# Patient Record
Sex: Male | Born: 1935 | Race: White | Hispanic: No | Smoking: Never smoker
Health system: Southern US, Community
[De-identification: ages and names within clinical notes are randomized; demographics above are authoritative.]

## PROBLEM LIST (undated history)

## (undated) DIAGNOSIS — I10 Essential (primary) hypertension: Secondary | ICD-10-CM

## (undated) DIAGNOSIS — E785 Hyperlipidemia, unspecified: Secondary | ICD-10-CM

## (undated) HISTORY — PX: PERMANENT PACEMAKER INSERTION: SHX6023

## (undated) HISTORY — PX: TOTAL HIP ARTHROPLASTY: SHX124

## (undated) HISTORY — PX: REPLACEMENT TOTAL KNEE: SUR1224

---

## 2020-02-15 ENCOUNTER — Other Ambulatory Visit: Payer: Self-pay

## 2020-02-15 ENCOUNTER — Emergency Department (HOSPITAL_COMMUNITY): Payer: Medicare Other

## 2020-02-15 ENCOUNTER — Inpatient Hospital Stay (HOSPITAL_COMMUNITY): Payer: Medicare Other

## 2020-02-15 ENCOUNTER — Inpatient Hospital Stay (HOSPITAL_COMMUNITY)
Admission: EM | Admit: 2020-02-15 | Discharge: 2020-03-10 | DRG: 682 | Disposition: E | Payer: Medicare Other | Attending: Family Medicine | Admitting: Family Medicine

## 2020-02-15 ENCOUNTER — Encounter (HOSPITAL_COMMUNITY): Payer: Self-pay | Admitting: Family Medicine

## 2020-02-15 DIAGNOSIS — Z96652 Presence of left artificial knee joint: Secondary | ICD-10-CM | POA: Diagnosis present

## 2020-02-15 DIAGNOSIS — Z515 Encounter for palliative care: Secondary | ICD-10-CM | POA: Diagnosis not present

## 2020-02-15 DIAGNOSIS — R578 Other shock: Secondary | ICD-10-CM | POA: Diagnosis not present

## 2020-02-15 DIAGNOSIS — Z9181 History of falling: Secondary | ICD-10-CM

## 2020-02-15 DIAGNOSIS — E872 Acidosis, unspecified: Secondary | ICD-10-CM

## 2020-02-15 DIAGNOSIS — I959 Hypotension, unspecified: Secondary | ICD-10-CM | POA: Diagnosis present

## 2020-02-15 DIAGNOSIS — L89159 Pressure ulcer of sacral region, unspecified stage: Secondary | ICD-10-CM | POA: Diagnosis present

## 2020-02-15 DIAGNOSIS — T17908A Unspecified foreign body in respiratory tract, part unspecified causing other injury, initial encounter: Secondary | ICD-10-CM | POA: Diagnosis not present

## 2020-02-15 DIAGNOSIS — U071 COVID-19: Secondary | ICD-10-CM | POA: Diagnosis present

## 2020-02-15 DIAGNOSIS — D696 Thrombocytopenia, unspecified: Secondary | ICD-10-CM | POA: Diagnosis present

## 2020-02-15 DIAGNOSIS — Z9911 Dependence on respirator [ventilator] status: Secondary | ICD-10-CM | POA: Diagnosis not present

## 2020-02-15 DIAGNOSIS — Y92002 Bathroom of unspecified non-institutional (private) residence single-family (private) house as the place of occurrence of the external cause: Secondary | ICD-10-CM | POA: Diagnosis not present

## 2020-02-15 DIAGNOSIS — Z950 Presence of cardiac pacemaker: Secondary | ICD-10-CM | POA: Diagnosis not present

## 2020-02-15 DIAGNOSIS — R579 Shock, unspecified: Secondary | ICD-10-CM | POA: Diagnosis not present

## 2020-02-15 DIAGNOSIS — N179 Acute kidney failure, unspecified: Secondary | ICD-10-CM | POA: Diagnosis not present

## 2020-02-15 DIAGNOSIS — E860 Dehydration: Secondary | ICD-10-CM | POA: Diagnosis present

## 2020-02-15 DIAGNOSIS — R0602 Shortness of breath: Secondary | ICD-10-CM

## 2020-02-15 DIAGNOSIS — E870 Hyperosmolality and hypernatremia: Secondary | ICD-10-CM | POA: Diagnosis present

## 2020-02-15 DIAGNOSIS — J1282 Pneumonia due to coronavirus disease 2019: Secondary | ICD-10-CM | POA: Diagnosis present

## 2020-02-15 DIAGNOSIS — R197 Diarrhea, unspecified: Secondary | ICD-10-CM | POA: Diagnosis not present

## 2020-02-15 DIAGNOSIS — R195 Other fecal abnormalities: Secondary | ICD-10-CM | POA: Diagnosis present

## 2020-02-15 DIAGNOSIS — D509 Iron deficiency anemia, unspecified: Secondary | ICD-10-CM | POA: Diagnosis present

## 2020-02-15 DIAGNOSIS — R54 Age-related physical debility: Secondary | ICD-10-CM | POA: Diagnosis present

## 2020-02-15 DIAGNOSIS — I10 Essential (primary) hypertension: Secondary | ICD-10-CM | POA: Diagnosis present

## 2020-02-15 DIAGNOSIS — W19XXXA Unspecified fall, initial encounter: Secondary | ICD-10-CM | POA: Diagnosis not present

## 2020-02-15 DIAGNOSIS — W010XXA Fall on same level from slipping, tripping and stumbling without subsequent striking against object, initial encounter: Secondary | ICD-10-CM | POA: Diagnosis present

## 2020-02-15 DIAGNOSIS — J811 Chronic pulmonary edema: Secondary | ICD-10-CM | POA: Diagnosis present

## 2020-02-15 DIAGNOSIS — S7292XA Unspecified fracture of left femur, initial encounter for closed fracture: Secondary | ICD-10-CM

## 2020-02-15 DIAGNOSIS — E162 Hypoglycemia, unspecified: Secondary | ICD-10-CM | POA: Diagnosis present

## 2020-02-15 DIAGNOSIS — S62102A Fracture of unspecified carpal bone, left wrist, initial encounter for closed fracture: Secondary | ICD-10-CM | POA: Diagnosis present

## 2020-02-15 DIAGNOSIS — N170 Acute kidney failure with tubular necrosis: Secondary | ICD-10-CM | POA: Diagnosis present

## 2020-02-15 DIAGNOSIS — Z96643 Presence of artificial hip joint, bilateral: Secondary | ICD-10-CM | POA: Diagnosis present

## 2020-02-15 DIAGNOSIS — J9601 Acute respiratory failure with hypoxia: Secondary | ICD-10-CM | POA: Diagnosis not present

## 2020-02-15 DIAGNOSIS — E875 Hyperkalemia: Secondary | ICD-10-CM | POA: Diagnosis present

## 2020-02-15 DIAGNOSIS — Z7982 Long term (current) use of aspirin: Secondary | ICD-10-CM

## 2020-02-15 DIAGNOSIS — S72002A Fracture of unspecified part of neck of left femur, initial encounter for closed fracture: Secondary | ICD-10-CM | POA: Diagnosis not present

## 2020-02-15 DIAGNOSIS — R680 Hypothermia, not associated with low environmental temperature: Secondary | ICD-10-CM | POA: Diagnosis present

## 2020-02-15 DIAGNOSIS — G9341 Metabolic encephalopathy: Secondary | ICD-10-CM | POA: Diagnosis not present

## 2020-02-15 DIAGNOSIS — N19 Unspecified kidney failure: Secondary | ICD-10-CM

## 2020-02-15 DIAGNOSIS — D539 Nutritional anemia, unspecified: Secondary | ICD-10-CM | POA: Diagnosis not present

## 2020-02-15 DIAGNOSIS — R188 Other ascites: Secondary | ICD-10-CM | POA: Diagnosis present

## 2020-02-15 DIAGNOSIS — Z66 Do not resuscitate: Secondary | ICD-10-CM | POA: Diagnosis present

## 2020-02-15 DIAGNOSIS — Z888 Allergy status to other drugs, medicaments and biological substances status: Secondary | ICD-10-CM

## 2020-02-15 DIAGNOSIS — L988 Other specified disorders of the skin and subcutaneous tissue: Secondary | ICD-10-CM | POA: Diagnosis present

## 2020-02-15 DIAGNOSIS — Y92009 Unspecified place in unspecified non-institutional (private) residence as the place of occurrence of the external cause: Secondary | ICD-10-CM | POA: Diagnosis not present

## 2020-02-15 DIAGNOSIS — S0083XA Contusion of other part of head, initial encounter: Secondary | ICD-10-CM | POA: Diagnosis present

## 2020-02-15 DIAGNOSIS — S0081XA Abrasion of other part of head, initial encounter: Secondary | ICD-10-CM | POA: Diagnosis present

## 2020-02-15 DIAGNOSIS — S7290XA Unspecified fracture of unspecified femur, initial encounter for closed fracture: Secondary | ICD-10-CM

## 2020-02-15 DIAGNOSIS — E785 Hyperlipidemia, unspecified: Secondary | ICD-10-CM | POA: Diagnosis present

## 2020-02-15 DIAGNOSIS — K922 Gastrointestinal hemorrhage, unspecified: Secondary | ICD-10-CM

## 2020-02-15 DIAGNOSIS — L98411 Non-pressure chronic ulcer of buttock limited to breakdown of skin: Secondary | ICD-10-CM | POA: Diagnosis present

## 2020-02-15 HISTORY — DX: Essential (primary) hypertension: I10

## 2020-02-15 HISTORY — DX: Hyperlipidemia, unspecified: E78.5

## 2020-02-15 LAB — BASIC METABOLIC PANEL
Anion gap: 13 (ref 5–15)
Anion gap: 11 (ref 5–15)
BUN: 71 mg/dL — ABNORMAL HIGH (ref 8–23)
BUN: 72 mg/dL — ABNORMAL HIGH (ref 8–23)
CO2: 13 mmol/L — ABNORMAL LOW (ref 22–32)
CO2: 16 mmol/L — ABNORMAL LOW (ref 22–32)
Calcium: 8.7 mg/dL — ABNORMAL LOW (ref 8.9–10.3)
Calcium: 8 mg/dL — ABNORMAL LOW (ref 8.9–10.3)
Chloride: 112 mmol/L — ABNORMAL HIGH (ref 98–111)
Chloride: 112 mmol/L — ABNORMAL HIGH (ref 98–111)
Creatinine, Ser: 3.98 mg/dL — ABNORMAL HIGH (ref 0.61–1.24)
Creatinine, Ser: 3.81 mg/dL — ABNORMAL HIGH (ref 0.61–1.24)
GFR, Estimated: 13 mL/min — ABNORMAL LOW (ref >60)
GFR, Estimated: 14 mL/min — ABNORMAL LOW (ref >60)
Glucose, Bld: 62 mg/dL — ABNORMAL LOW (ref 70–99)
Glucose, Bld: 106 mg/dL — ABNORMAL HIGH (ref 70–99)
Potassium: 5.6 mmol/L — ABNORMAL HIGH (ref 3.5–5.1)
Potassium: 4.9 mmol/L (ref 3.5–5.1)
Sodium: 138 mmol/L (ref 135–145)
Sodium: 139 mmol/L (ref 135–145)

## 2020-02-15 LAB — RETICULOCYTES
Immature Retic Fract: 13.1 % (ref 2.3–15.9)
RBC.: 2.59 MIL/uL — ABNORMAL LOW (ref 4.22–5.81)
Retic Count, Absolute: 21.5 10*3/uL (ref 19.0–186.0)
Retic Ct Pct: 0.8 % (ref 0.4–3.1)

## 2020-02-15 LAB — I-STAT VENOUS BLOOD GAS, ED
Acid-base deficit: 12 mmol/L — ABNORMAL HIGH (ref 0.0–2.0)
Bicarbonate: 13.4 mmol/L — ABNORMAL LOW (ref 20.0–28.0)
Calcium, Ion: 1.07 mmol/L — ABNORMAL LOW (ref 1.15–1.40)
HCT: 23 % — ABNORMAL LOW (ref 39.0–52.0)
Hemoglobin: 7.8 g/dL — ABNORMAL LOW (ref 13.0–17.0)
O2 Saturation: 87 %
Potassium: 5.3 mmol/L — ABNORMAL HIGH (ref 3.5–5.1)
Sodium: 142 mmol/L (ref 135–145)
TCO2: 14 mmol/L — ABNORMAL LOW (ref 22–32)
pCO2, Ven: 27.6 mmHg — ABNORMAL LOW (ref 44.0–60.0)
pH, Ven: 7.294 (ref 7.250–7.430)
pO2, Ven: 57 mmHg — ABNORMAL HIGH (ref 32.0–45.0)

## 2020-02-15 LAB — VITAMIN B12: Vitamin B-12: 1032 pg/mL — ABNORMAL HIGH (ref 180–914)

## 2020-02-15 LAB — CBC
HCT: 27.9 % — ABNORMAL LOW (ref 39.0–52.0)
HCT: 26.6 % — ABNORMAL LOW (ref 39.0–52.0)
Hemoglobin: 8.7 g/dL — ABNORMAL LOW (ref 13.0–17.0)
Hemoglobin: 8.5 g/dL — ABNORMAL LOW (ref 13.0–17.0)
MCH: 33 pg (ref 26.0–34.0)
MCH: 32.8 pg (ref 26.0–34.0)
MCHC: 31.2 g/dL (ref 30.0–36.0)
MCHC: 32 g/dL (ref 30.0–36.0)
MCV: 105.7 fL — ABNORMAL HIGH (ref 80.0–100.0)
MCV: 102.7 fL — ABNORMAL HIGH (ref 80.0–100.0)
Platelets: 95 10*3/uL — ABNORMAL LOW (ref 150–400)
Platelets: 97 10*3/uL — ABNORMAL LOW (ref 150–400)
RBC: 2.64 MIL/uL — ABNORMAL LOW (ref 4.22–5.81)
RBC: 2.59 MIL/uL — ABNORMAL LOW (ref 4.22–5.81)
RDW: 15 % (ref 11.5–15.5)
RDW: 14.9 % (ref 11.5–15.5)
WBC: 10.6 10*3/uL — ABNORMAL HIGH (ref 4.0–10.5)
WBC: 9.1 10*3/uL (ref 4.0–10.5)
nRBC: 0 % (ref 0.0–0.2)
nRBC: 0 % (ref 0.0–0.2)

## 2020-02-15 LAB — IRON AND TIBC
Iron: 15 ug/dL — ABNORMAL LOW (ref 45–182)
Saturation Ratios: 6 % — ABNORMAL LOW (ref 17.9–39.5)
TIBC: 235 ug/dL — ABNORMAL LOW (ref 250–450)
UIBC: 220 ug/dL

## 2020-02-15 LAB — HEPATIC FUNCTION PANEL
ALT: 19 U/L (ref 0–44)
AST: 39 U/L (ref 15–41)
Albumin: 2.7 g/dL — ABNORMAL LOW (ref 3.5–5.0)
Alkaline Phosphatase: 128 U/L — ABNORMAL HIGH (ref 38–126)
Bilirubin, Direct: 0.5 mg/dL — ABNORMAL HIGH (ref 0.0–0.2)
Indirect Bilirubin: 0.6 mg/dL (ref 0.3–0.9)
Total Bilirubin: 1.1 mg/dL (ref 0.3–1.2)
Total Protein: 6.4 g/dL — ABNORMAL LOW (ref 6.5–8.1)

## 2020-02-15 LAB — MAGNESIUM: Magnesium: 2.2 mg/dL (ref 1.7–2.4)

## 2020-02-15 LAB — RESP PANEL BY RT-PCR (FLU A&B, COVID) ARPGX2
Influenza A by PCR: NEGATIVE
Influenza A by PCR: NEGATIVE
Influenza B by PCR: NEGATIVE
Influenza B by PCR: NEGATIVE
SARS Coronavirus 2 by RT PCR: POSITIVE — AB
SARS Coronavirus 2 by RT PCR: POSITIVE — AB

## 2020-02-15 LAB — PREPARE RBC (CROSSMATCH)

## 2020-02-15 LAB — TROPONIN I (HIGH SENSITIVITY)
Troponin I (High Sensitivity): 85 ng/L — ABNORMAL HIGH (ref <18)
Troponin I (High Sensitivity): 77 ng/L — ABNORMAL HIGH (ref <18)

## 2020-02-15 LAB — POC OCCULT BLOOD, ED: Fecal Occult Bld: POSITIVE — AB

## 2020-02-15 LAB — APTT: aPTT: 42 seconds — ABNORMAL HIGH (ref 24–36)

## 2020-02-15 LAB — FERRITIN: Ferritin: 270 ng/mL (ref 24–336)

## 2020-02-15 LAB — PROTIME-INR
INR: 1.7 — ABNORMAL HIGH (ref 0.8–1.2)
Prothrombin Time: 19.4 seconds — ABNORMAL HIGH (ref 11.4–15.2)

## 2020-02-15 LAB — URINALYSIS, ROUTINE W REFLEX MICROSCOPIC
Bacteria, UA: NONE SEEN
Bilirubin Urine: NEGATIVE
Glucose, UA: NEGATIVE mg/dL
Hgb urine dipstick: NEGATIVE
Ketones, ur: NEGATIVE mg/dL
Leukocytes,Ua: NEGATIVE
Nitrite: NEGATIVE
Protein, ur: 30 mg/dL — AB
Specific Gravity, Urine: 1.021 (ref 1.005–1.030)
pH: 5 (ref 5.0–8.0)

## 2020-02-15 LAB — LACTIC ACID, PLASMA
Lactic Acid, Venous: 2.1 mmol/L (ref 0.5–1.9)
Lactic Acid, Venous: 1.8 mmol/L (ref 0.5–1.9)

## 2020-02-15 LAB — FOLATE: Folate: 8.8 ng/mL (ref >5.9)

## 2020-02-15 LAB — ABO/RH: ABO/RH(D): A POS

## 2020-02-15 MED ORDER — ACETAMINOPHEN 325 MG PO TABS
650.0000 mg | ORAL_TABLET | Freq: Once | ORAL | Status: AC
  Administered 2020-02-15: 650 mg via ORAL
  Filled 2020-02-15: qty 2

## 2020-02-15 MED ORDER — NOREPINEPHRINE 4 MG/250ML-% IV SOLN
INTRAVENOUS | Status: AC
  Administered 2020-02-16: 2 ug/min via INTRAVENOUS
  Filled 2020-02-15: qty 250

## 2020-02-15 MED ORDER — SODIUM CHLORIDE 0.9 % IV BOLUS
500.0000 mL | Freq: Once | INTRAVENOUS | Status: AC
  Administered 2020-02-15: 500 mL via INTRAVENOUS

## 2020-02-15 MED ORDER — SODIUM BICARBONATE 8.4 % IV SOLN
50.0000 meq | Freq: Once | INTRAVENOUS | Status: AC
  Administered 2020-02-15: 50 meq via INTRAVENOUS
  Filled 2020-02-15: qty 50

## 2020-02-15 MED ORDER — SODIUM BICARBONATE 8.4 % IV SOLN
INTRAVENOUS | Status: DC
  Filled 2020-02-15: qty 850

## 2020-02-15 MED ORDER — SODIUM CHLORIDE 0.9 % IV BOLUS (SEPSIS)
500.0000 mL | Freq: Once | INTRAVENOUS | Status: AC
  Administered 2020-02-15: 500 mL via INTRAVENOUS

## 2020-02-15 MED ORDER — HYDROCODONE-ACETAMINOPHEN 5-325 MG PO TABS: 1.0000 | ORAL_TABLET | Freq: Four times a day (QID) | ORAL | Status: DC | PRN

## 2020-02-15 MED ORDER — SODIUM BICARBONATE 8.4 % IV SOLN
INTRAVENOUS | Status: DC
  Filled 2020-02-15 (×4): qty 850

## 2020-02-15 MED ORDER — SODIUM CHLORIDE 0.9% IV SOLUTION: Freq: Once | INTRAVENOUS | Status: AC

## 2020-02-15 MED ORDER — MORPHINE SULFATE (PF) 2 MG/ML IV SOLN
0.5000 mg | INTRAVENOUS | Status: DC | PRN
  Administered 2020-02-15 (×2): 0.5 mg via INTRAVENOUS
  Filled 2020-02-15 (×2): qty 1

## 2020-02-15 MED ORDER — DEXTROSE 50 % IV SOLN
1.0000 | Freq: Once | INTRAVENOUS | Status: AC
  Administered 2020-02-15: 50 mL via INTRAVENOUS
  Filled 2020-02-15: qty 50

## 2020-02-15 NOTE — H&P (Addendum)
History and Physical  Derek Mcdaniel WJX:[id number] DOB: October 05, 1921 DOA: 02/28/2020  PCP: Lavonne Chick, MD   Chief Complaint: fall  HPI:  87 year old man veteran of [place], [place] retired Animal nutritionist, PMH pacemaker, essential hypertension hyperlipidemia presented to the emergency department after a fall at home.  Work-up revealed left proximal femur fracture, acute kidney injury, anemia, thrombocytopenia, hypotension and hypothermia.  Despite this, no signs or symptoms to suggest sepsis.  Screening Covid was positive though this may be error.  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  He just visited his daughter who lives here for Christmas returned home fell and broke his wrist, therefore she brought him back several days ago.  Appetite has been poor and oral intake has been quite poor.  He has been using a walker since being at his daughter's.  Today he was ambulating to the bathroom where he lost his balance and fell forward.  No loss of consciousness reported.  Grandson was at his side.  Patient was unable to get up so EMS was called.   Patient denies pain.  He has no other complaints and his review of systems is unrevealing.  Reports 2 weeks of diarrhea.  Chart review: . First admission this hospital system  ED Course: Treated with fluids, imaging pending.  Review of Systems:  Negative for fever, visual changes, sore throat, rash, chest pain, shortness of breath, dysuria, bleeding, nausea, vomiting, abdominal pain.  PMH . Essential hypertension . Hyperlipidemia . No cardiac disease other than pacemaker placement.  PSH . Pacemaker placement . Left knee replacement . Bilateral total hip arthroplasty  Family history includes: . Lived to old age, no specific medical problems were called  Social History . Never smoker . Nondrinker  Allergies . Ambien caused agitation  Meds  include: . Take something for hypertension and hyperlipidemia  Physicial Exam   Vitals:  . 96.2, 16, 69, 70, 92/54  Constitutional:   . Appears calm and comfortable lying on stretcher in the emergency department.  Appears frail but nontoxic.  Does not appear ill. Eyes:  . pupils and irises appear normal . Normal lids  ENMT:  . grossly normal hearing  . Lips appear normal Neck:  . neck appears normal, no masses . no thyromegaly Respiratory:  . CTA bilaterally, no w/r/r.  . Respiratory effort normal.  Cardiovascular:  . RRR, no m/r/g . No LE extremity edema   Abdomen:  . Abdomen appears normal; no tenderness or masses . No hernias noted Musculoskeletal:  . RUE, LUE, RLE, LLE   . Moves all extremities to command.  Strength and tone appear grossly intact.  Some left leg weakness probably from hip fracture. Skin:  . Grossly unremarkable Neurologic:  . Grossly nonfocal Psychiatric:  . Mental status o Mood, affect appropriate . judgment and insight appear intact   I have personally reviewed following labs and imaging studies  Labs:  Marland Kitchen Potassium 5.6, glucose 62, creatinine 3.98, BUN 71 . Troponins mildly elevated 85, 77 . Lactic acid within normal limits . Hemoglobin 8.7, 7.8 macrocytic . Platelets 95 . INR 1.7 . Screening Covid positive . Fecal occult blood positive  Imaging studies:   Plain film left femur fracture, possible left anterior pubic ramus fracture  Chest x-ray independently reviewed no acute disease  Medical tests:   EKG independently reviewed: Ventricular paced rhythm  ASSESSMENT/PLAN  87 year old man veteran World War [place], [place],  presented after a fall and found to have multiple abnormalities including proximal left femur fracture, acute kidney injury with hyperkalemia, anemia, thrombocytopenia, hypotension and hypothermia.  Acute kidney injury with mild hyperkalemia, nonanion gap metabolic acidosis --Prerenal pattern, history of  very poor oral intake, may be complicated by hypotension.  No signs or symptoms to suggest sepsis at this time. --Given hypoglycemia, D5 with sodium bicarb for acidosis. --Trend BMP.  Treat potassium w/ IVF, and repeat tonight.  Hypotension, hypothermia, diarrhea --Patient appears frail but otherwise well, there are no signs or symptoms to suggest sepsis or significant infection.  History most consistent with profound dehydration secondary to poor oral intake and 2 weeks of diarrhea. Cannot r/o component of fx hemorrhage at this time. --Passive rewarming, aggressive IV fluids, rule out C. Difficile, trend Hgb.  Macrocytic anemia, thrombocytopenia --Unclear chronicity, suspect component of acute blood loss anemia from hip fx, superimposed on chronic anemia. Fecal occult blood positive, unclear significance, no reported bleeding, however has had diarrhea for the last 2 weeks.  Could be hemorrhoidal. --Hemoglobin slightly lower.  Blood is acceptable if needed. Type and cross 2 units but hold for now.  Trend hemoglobin.  Check anemia panel.  Fall at home --Patient denies complaints.  Left femur fracture on plain films.  Possible pubic ramus fracture. --CT head, maxillofacial, spine, abdomen pelvis pending.  Proximal left femur fracture status post fall --Hip fracture protocol.  Orthopedics consultation is pending.  Patient has no cardiac history, still works and normally ambulates with a cane.  No history of chest pain or shortness of breath.  Advanced age is biggest risk factor, however would proceed with surgery w/o further medical workup, however note cervical spine CT: "Advanced arthropathy at the C1-2 articulation with 6 mm of posterior displacement of C1 relative to C2. Stenosis of the spinal canal at this level that could affect the spinal cord. I do not see evidence of an acute fracture in that region. 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 Other arthritides could also affect this  joint." --discussed w/  Dr. Lovell Sheehan and advised on precautions/recs in regard to anesthesia for ortho surgery. Neutral intubation with glidoscope recommended.  Positive Covid test --May be spurious.  The patient next-door reportedly is Covid positive diagnosed prior to admission, however was negative here in the emergency department.  Will retest, continue airborne precautions for now. --If second test is negative, would DC precautions.  Wrist fracture from fall prior to admission --Cast just put on recently.  Follow-up as an outpatient.  Although patient appears clinically much better than chart findings would suggest, admit to progressive care given multiple acute abnormalities and prognosis guarded.  DVT prophylaxis: SCDs Code Status: DNR Family Communication: daughter Isabelle Course at bedside Consults called: orthopedics    Time spent: 34 minutes  Brendia Sacks, MD  [facility] Direct contact: see [email or web address]  7PM-7AM contact night coverage as below   1. Check the care team in [facility] and look for a) attending/consulting [facility] provider listed and b) the [facility] team listed 2. Log into [email or web address] and use [facility] universal password to access. If you do not have the password, please contact the hospital operator. 3. Locate the [facility] provider you are looking for under [facility] and page to a number that you can be directly reached. 4. If you still have difficulty reaching the provider, please page the [facility] (Director on Call) for the Hospitalists listed on amion for assistance.  Severity of Illness: The appropriate patient status for this patient  is INPATIENT. Inpatient status is judged to be reasonable and necessary in order to provide the required intensity of service to ensure the patient's safety. The patient's presenting symptoms, physical exam findings, and initial radiographic and laboratory data in the context of their chronic comorbidities is felt to place them at high  risk for further clinical deterioration. Furthermore, it is not anticipated that the patient will be medically stable for discharge from the hospital within 2 midnights of admission. The following factors support the patient status of inpatient.   " The patient's presenting symptoms include fall at home. " The worrisome physical exam findings include hypotension, hyporthermia. " The initial radiographic and laboratory data are worrisome because of AKI, anemia, thrombocytopenia, femur fracture. " The chronic co-morbidities include HTN, hyperlipidemia.   * I certify that at the point of admission it is my clinical judgment that the patient will require inpatient hospital care spanning beyond 2 midnights from the point of admission due to high intensity of service, high risk for further deterioration and high frequency of surveillance required.*   Status is: Inpatient  Remains inpatient appropriate because:Hemodynamically unstable, IV treatments appropriate due to intensity of illness or inability to take PO and Inpatient level of care appropriate due to severity of illness   Dispo: The patient is from: Home              Anticipated d/c is to: TBD              Anticipated d/c date is: > 3 days              Patient currently is not medically stable to d/c.  03/02/2020, 7:35 PM   Principal Problem:   AKI (acute kidney injury) (HCC) Active Problems:   Metabolic acidosis   Hypotension   Diarrhea   Macrocytic anemia   Thrombocytopenia (HCC)   Fall at home, initial encounter   Femur fracture (HCC)

## 2020-02-15 NOTE — ED Provider Notes (Addendum)
[facility] EMERGENCY DEPARTMENT Provider Note   CSN: [id number] Arrival date & time: 02/24/2020  1426     History Chief Complaint  Patient presents with  . Weakness    Derek Mcdaniel is a 87 y.o. male.  HPI   87 year old male with past medical history of HTN, HLD, pacemaker, left forearm injury that is casted from a previous mechanical fall who currently lives home alone with no help presents to the emergency department with fall and weakness.  Patient states over the last 3 days has had decreased appetite, he has had profuse nonbloody diarrhea with lower abdominal cramping.  Today when he was in the bathroom and he tried to reach to turn the light on he said he fell forward, hitting the front of his face on the ground.  Did not lose consciousness.  He usually ambulates slowly but independently with a walker.  He admits to mild left upper thigh pain.  He is noted to be on baby aspirin daily, patient believes he takes this every day.  He denies any chest pain, shortness of breath, cough, vomiting.  No past medical history on file.  There are no problems to display for this patient.      No family history on file.     Home Medications Prior to Admission medications   Not on File    Allergies    Patient has no allergy information on record.  Review of Systems   Review of Systems  Constitutional: Positive for appetite change, chills and fatigue. Negative for fever.  HENT: Negative for congestion.   Eyes: Negative for visual disturbance.  Respiratory: Negative for shortness of breath.   Cardiovascular: Negative for chest pain.  Gastrointestinal: Positive for abdominal pain and diarrhea. Negative for blood in stool and vomiting.  Genitourinary: Negative for dysuria.  Musculoskeletal:       Left forearm injury from previous fall  Skin: Negative for rash.  Neurological: Negative for headaches.    Physical Exam Updated Vital Signs BP (!) 79/50 (BP  Location: Right Arm)   Pulse 68   Temp (!) 96.2 F (35.7 C) (Rectal)   Resp 15   Ht 5\' 5"  (1.651 m)   Wt 61.2 kg   SpO2 100%   BMI 22.47 kg/m   Physical Exam Vitals and nursing note reviewed.  Constitutional:      Appearance: Normal appearance.  HENT:     Head:     Comments: Scattered bruising and small abrasions along the frontal area    Nose: Nose normal.     Mouth/Throat:     Mouth: Mucous membranes are moist.  Eyes:     Extraocular Movements: Extraocular movements intact.     Comments: Pinpoint pupils but reactive and equal bilaterally  Cardiovascular:     Rate and Rhythm: Normal rate.  Pulmonary:     Effort: Pulmonary effort is normal. No respiratory distress.  Abdominal:     Palpations: Abdomen is soft.     Comments: Mild tenderness to palpation in the lower quadrants without any guarding  Genitourinary:    Comments: Significant skin breakdown mostly on the right groin, small pressure sore in the sacral area Musculoskeletal:        General: Swelling present. No deformity.     Cervical back: Neck supple. No tenderness.     Comments: Mild tenderness to the left lateral and upper thigh, no obvious deformity, no significant pain with passive range of motion, patient has limited active range  of motion which she says is baseline  Skin:    General: Skin is warm.  Neurological:     Mental Status: He is alert and oriented to person, place, and time. Mental status is at baseline.  Psychiatric:        Mood and Affect: Mood normal.     ED Results / Procedures / Treatments   Labs (all labs ordered are listed, but only abnormal results are displayed) Labs Reviewed  BASIC METABOLIC PANEL - Abnormal; Notable for the following components:      Result Value   Potassium 5.6 (*)    Chloride 112 (*)    CO2 13 (*)    Glucose, Bld 62 (*)    BUN 71 (*)    Creatinine, Ser 3.98 (*)    Calcium 8.7 (*)    GFR, Estimated 13 (*)    All other components within normal limits  CBC -  Abnormal; Notable for the following components:   WBC 10.6 (*)    RBC 2.64 (*)    Hemoglobin 8.7 (*)    HCT 27.9 (*)    MCV 105.7 (*)    Platelets 95 (*)    All other components within normal limits  LACTIC ACID, PLASMA - Abnormal; Notable for the following components:   Lactic Acid, Venous 2.1 (*)    All other components within normal limits  CULTURE, BLOOD (SINGLE)  URINALYSIS, ROUTINE W REFLEX MICROSCOPIC  LACTIC ACID, PLASMA  MAGNESIUM  PROTIME-INR  APTT  CBG MONITORING, ED  POC OCCULT BLOOD, ED  TROPONIN I (HIGH SENSITIVITY)    EKG None  Radiology No results found.  Procedures .Critical Care Performed by: Rozelle Logan, DO Authorized by: Rozelle Logan, DO   Critical care provider statement:    Critical care time (minutes):  60   Critical care was necessary to treat or prevent imminent or life-threatening deterioration of the following conditions:  Circulatory failure, dehydration, endocrine crisis, metabolic crisis, shock, sepsis and trauma   Critical care was time spent personally by me on the following activities:  Discussions with consultants, evaluation of patient's response to treatment, examination of patient, ordering and performing treatments and interventions, ordering and review of laboratory studies, ordering and review of radiographic studies, pulse oximetry, re-evaluation of patient's condition, obtaining history from patient or surrogate and review of old charts   I assumed direction of critical care for this patient from another provider in my specialty: no     (including critical care time)  Medications Ordered in ED Medications  sodium chloride 0.9 % bolus 500 mL (has no administration in time range)    ED Course  I have reviewed the triage vital signs and the nursing notes.  Pertinent labs & imaging results that were available during my care of the patient were reviewed by me and considered in my medical decision making (see chart for  details).    MDM Rules/Calculators/A&P                          87 year old male presents the emergency department with mechanical fall and head/face injury.  3 days of diarrhea, decreased appetite and lower abdominal discomfort.  He is hypotensive and hypothermic on arrival.  Eyes open, conversational and oriented.  Left forearm is casted from previous fall.  Vitals are otherwise stable, he is on room air and conversational.  EKG is a paced rhythm.  Blood work shows an anemia, unclear what his  baseline is.  He is fecal occult positive.  Patient has renal failure with hyperkalemia and low bicarb.  His blood pressure was 70 systolic, he is sitting up, conversational and oriented.  He is also noted to be hypothermic.  He has some lower abdominal discomfort to palpation and left hip pain.  X-ray shows a left hip fracture and possible pubic rami deformity.  Patient's blood pressure has been fluid responsive.  Consulted nephrology who recommend amp of bicarb and then bicarb/D5 fluids for hyperkalemia and hydration.  Orthopedics has been consulted for the hip fracture.  Type and screen has been ordered.  We are pending CTs of the head, face and neck as well as abdomen pelvis with the complaint of diarrhea.  Patient will be admitted to hospitalist with nephrology and orthopedic consult.  On reevaluation blood pressure is systolic in the 80s, he continues to be baseline cognition and conversational.  Stable at time of admission.  Final Clinical Impression(s) / ED Diagnoses Final diagnoses:  None    Rx / DC Orders ED Discharge Orders    None       Rozelle Logan, DO 03-01-20 1809    Rozelle Logan, DO Mar 01, 2020 1810

## 2020-02-15 NOTE — ED Triage Notes (Addendum)
Patient arrived by [facility] from home after having witnessed fall today. Patient alert and denies pain. No respiratory complaints. Arrived with cast to left arm. Patient found to have BP in the 70s

## 2020-02-16 ENCOUNTER — Inpatient Hospital Stay (HOSPITAL_COMMUNITY): Payer: Medicare Other

## 2020-02-16 ENCOUNTER — Encounter (HOSPITAL_COMMUNITY): Payer: Self-pay | Admitting: Critical Care Medicine

## 2020-02-16 DIAGNOSIS — Z9911 Dependence on respirator [ventilator] status: Secondary | ICD-10-CM

## 2020-02-16 DIAGNOSIS — S72002A Fracture of unspecified part of neck of left femur, initial encounter for closed fracture: Secondary | ICD-10-CM | POA: Diagnosis not present

## 2020-02-16 DIAGNOSIS — D539 Nutritional anemia, unspecified: Secondary | ICD-10-CM

## 2020-02-16 DIAGNOSIS — I959 Hypotension, unspecified: Secondary | ICD-10-CM | POA: Diagnosis not present

## 2020-02-16 DIAGNOSIS — S7292XA Unspecified fracture of left femur, initial encounter for closed fracture: Secondary | ICD-10-CM | POA: Diagnosis not present

## 2020-02-16 DIAGNOSIS — N179 Acute kidney failure, unspecified: Secondary | ICD-10-CM | POA: Diagnosis not present

## 2020-02-16 DIAGNOSIS — R579 Shock, unspecified: Secondary | ICD-10-CM | POA: Diagnosis not present

## 2020-02-16 LAB — I-STAT ARTERIAL BLOOD GAS, ED
Acid-base deficit: 5 mmol/L — ABNORMAL HIGH (ref 0.0–2.0)
Acid-base deficit: 5 mmol/L — ABNORMAL HIGH (ref 0.0–2.0)
Bicarbonate: 20.7 mmol/L (ref 20.0–28.0)
Bicarbonate: 20 mmol/L (ref 20.0–28.0)
Calcium, Ion: 1.06 mmol/L — ABNORMAL LOW (ref 1.15–1.40)
Calcium, Ion: 1.07 mmol/L — ABNORMAL LOW (ref 1.15–1.40)
HCT: 27 % — ABNORMAL LOW (ref 39.0–52.0)
HCT: 27 % — ABNORMAL LOW (ref 39.0–52.0)
Hemoglobin: 9.2 g/dL — ABNORMAL LOW (ref 13.0–17.0)
Hemoglobin: 9.2 g/dL — ABNORMAL LOW (ref 13.0–17.0)
O2 Saturation: 62 %
O2 Saturation: 90 %
Potassium: 4.5 mmol/L (ref 3.5–5.1)
Potassium: 4.5 mmol/L (ref 3.5–5.1)
Sodium: 142 mmol/L (ref 135–145)
Sodium: 143 mmol/L (ref 135–145)
TCO2: 22 mmol/L (ref 22–32)
TCO2: 21 mmol/L — ABNORMAL LOW (ref 22–32)
pCO2 arterial: 37.6 mmHg (ref 32.0–48.0)
pCO2 arterial: 33.5 mmHg (ref 32.0–48.0)
pH, Arterial: 7.348 — ABNORMAL LOW (ref 7.350–7.450)
pH, Arterial: 7.383 (ref 7.350–7.450)
pO2, Arterial: 34 mmHg — CL (ref 83.0–108.0)
pO2, Arterial: 60 mmHg — ABNORMAL LOW (ref 83.0–108.0)

## 2020-02-16 LAB — RENAL FUNCTION PANEL
Albumin: 2.4 g/dL — ABNORMAL LOW (ref 3.5–5.0)
Anion gap: 15 (ref 5–15)
BUN: 73 mg/dL — ABNORMAL HIGH (ref 8–23)
CO2: 16 mmol/L — ABNORMAL LOW (ref 22–32)
Calcium: 7.9 mg/dL — ABNORMAL LOW (ref 8.9–10.3)
Chloride: 110 mmol/L (ref 98–111)
Creatinine, Ser: 3.88 mg/dL — ABNORMAL HIGH (ref 0.61–1.24)
GFR, Estimated: 14 mL/min — ABNORMAL LOW (ref >60)
Glucose, Bld: 124 mg/dL — ABNORMAL HIGH (ref 70–99)
Phosphorus: 5.7 mg/dL — ABNORMAL HIGH (ref 2.5–4.6)
Potassium: 5 mmol/L (ref 3.5–5.1)
Sodium: 141 mmol/L (ref 135–145)

## 2020-02-16 LAB — CBC
HCT: 26.9 % — ABNORMAL LOW (ref 39.0–52.0)
Hemoglobin: 9 g/dL — ABNORMAL LOW (ref 13.0–17.0)
MCH: 33.5 pg (ref 26.0–34.0)
MCHC: 33.5 g/dL (ref 30.0–36.0)
MCV: 100 fL (ref 80.0–100.0)
Platelets: 85 10*3/uL — ABNORMAL LOW (ref 150–400)
RBC: 2.69 MIL/uL — ABNORMAL LOW (ref 4.22–5.81)
RDW: 15.1 % (ref 11.5–15.5)
WBC: 7 10*3/uL (ref 4.0–10.5)
nRBC: 0 % (ref 0.0–0.2)

## 2020-02-16 LAB — SODIUM, URINE, RANDOM: Sodium, Ur: 19 mmol/L

## 2020-02-16 LAB — D-DIMER, QUANTITATIVE: D-Dimer, Quant: >20 ug/mL-FEU — ABNORMAL HIGH (ref 0.00–0.50)

## 2020-02-16 LAB — LACTIC ACID, PLASMA: Lactic Acid, Venous: 2.4 mmol/L (ref 0.5–1.9)

## 2020-02-16 LAB — CREATININE, URINE, RANDOM: Creatinine, Urine: 145.51 mg/dL

## 2020-02-16 LAB — SARS CORONAVIRUS 2 (TAT 6-24 HRS): SARS Coronavirus 2: POSITIVE — AB

## 2020-02-16 LAB — FERRITIN: Ferritin: 296 ng/mL (ref 24–336)

## 2020-02-16 LAB — C-REACTIVE PROTEIN: CRP: 16.7 mg/dL — ABNORMAL HIGH (ref <1.0)

## 2020-02-16 MED ORDER — SODIUM CHLORIDE 0.9 % IV SOLN
250.0000 mL | INTRAVENOUS | Status: DC
  Administered 2020-02-16: 250 mL via INTRAVENOUS

## 2020-02-16 MED ORDER — LOPERAMIDE HCL 2 MG PO CAPS: 2.0000 mg | ORAL_CAPSULE | ORAL | Status: DC | PRN

## 2020-02-16 MED ORDER — SODIUM CHLORIDE 0.9 % IV SOLN: 100.0000 mg | Freq: Every day | INTRAVENOUS | Status: DC

## 2020-02-16 MED ORDER — GLYCOPYRROLATE 0.2 MG/ML IJ SOLN: 0.2000 mg | INTRAMUSCULAR | Status: DC | PRN

## 2020-02-16 MED ORDER — GLYCOPYRROLATE 1 MG PO TABS
1.0000 mg | ORAL_TABLET | ORAL | Status: DC | PRN
  Filled 2020-02-16: qty 1

## 2020-02-16 MED ORDER — NOREPINEPHRINE 4 MG/250ML-% IV SOLN: 2.0000 ug/min | INTRAVENOUS | Status: DC

## 2020-02-16 MED ORDER — ONDANSETRON 4 MG PO TBDP: 4.0000 mg | ORAL_TABLET | Freq: Four times a day (QID) | ORAL | Status: DC | PRN

## 2020-02-16 MED ORDER — POLYVINYL ALCOHOL 1.4 % OP SOLN
1.0000 [drp] | Freq: Four times a day (QID) | OPHTHALMIC | Status: DC | PRN
  Filled 2020-02-16: qty 15

## 2020-02-16 MED ORDER — ONDANSETRON HCL 4 MG/2ML IJ SOLN: 4.0000 mg | Freq: Four times a day (QID) | INTRAMUSCULAR | Status: DC | PRN

## 2020-02-16 MED ORDER — FENTANYL CITRATE (PF) 100 MCG/2ML IJ SOLN
50.0000 ug | INTRAMUSCULAR | Status: DC | PRN
Start: 2020-02-16 — End: 2020-02-17
  Administered 2020-02-17: 50 ug via INTRAVENOUS

## 2020-02-16 MED ORDER — SODIUM CHLORIDE 0.9 % IV SOLN
1.0000 g | INTRAVENOUS | Status: DC
  Administered 2020-02-16: 1 g via INTRAVENOUS
  Filled 2020-02-16: qty 10

## 2020-02-16 MED ORDER — ACETAMINOPHEN 325 MG PO TABS: 650.0000 mg | ORAL_TABLET | Freq: Four times a day (QID) | ORAL | Status: DC | PRN

## 2020-02-16 MED ORDER — SODIUM CHLORIDE 0.9 % IV SOLN
200.0000 mg | Freq: Once | INTRAVENOUS | Status: DC
  Filled 2020-02-16: qty 40

## 2020-02-16 MED ORDER — SODIUM CHLORIDE 0.9 % IV SOLN
500.0000 mg | INTRAVENOUS | Status: DC
  Filled 2020-02-16: qty 500

## 2020-02-16 MED ORDER — FENTANYL CITRATE (PF) 100 MCG/2ML IJ SOLN
25.0000 ug | INTRAMUSCULAR | Status: DC | PRN
Start: 2020-02-16 — End: 2020-02-16
  Administered 2020-02-16: 25 ug via INTRAVENOUS
  Filled 2020-02-16: qty 2

## 2020-02-16 MED ORDER — ACETAMINOPHEN 650 MG RE SUPP: 650.0000 mg | Freq: Four times a day (QID) | RECTAL | Status: DC | PRN

## 2020-02-16 MED ORDER — HALOPERIDOL LACTATE 5 MG/ML IJ SOLN: 0.5000 mg | INTRAMUSCULAR | Status: DC | PRN

## 2020-02-16 MED ORDER — SODIUM CHLORIDE 0.9 % IV BOLUS
1000.0000 mL | Freq: Once | INTRAVENOUS | Status: AC
  Administered 2020-02-16: 1000 mL via INTRAVENOUS

## 2020-02-16 MED ORDER — HALOPERIDOL 1 MG PO TABS: 0.5000 mg | ORAL_TABLET | ORAL | Status: DC | PRN

## 2020-02-16 MED ORDER — LORAZEPAM 2 MG/ML IJ SOLN
1.0000 mg | Freq: Once | INTRAMUSCULAR | Status: AC
  Administered 2020-02-16: 1 mg via INTRAVENOUS
  Filled 2020-02-16: qty 1

## 2020-02-16 MED ORDER — LORAZEPAM 2 MG/ML IJ SOLN
1.0000 mg | INTRAMUSCULAR | Status: DC | PRN
  Administered 2020-02-16 – 2020-02-17 (×2): 1 mg via INTRAVENOUS
  Filled 2020-02-16 (×2): qty 1

## 2020-02-16 MED ORDER — HALOPERIDOL LACTATE 2 MG/ML PO CONC
0.5000 mg | ORAL | Status: DC | PRN
  Filled 2020-02-16: qty 0.3

## 2020-02-16 MED ORDER — FENTANYL 2500MCG IN NS 250ML (10MCG/ML) PREMIX INFUSION
0.0000 ug/h | INTRAVENOUS | Status: DC
  Administered 2020-02-16: 25 ug/h via INTRAVENOUS
  Filled 2020-02-16: qty 250

## 2020-02-16 MED ORDER — LACTATED RINGERS IV BOLUS
1000.0000 mL | Freq: Once | INTRAVENOUS | Status: AC
  Administered 2020-02-16: 1000 mL via INTRAVENOUS

## 2020-02-16 MED ORDER — FENTANYL CITRATE (PF) 100 MCG/2ML IJ SOLN
50.0000 ug | INTRAMUSCULAR | Status: DC | PRN
Start: 2020-02-16 — End: 2020-02-17

## 2020-02-16 MED ORDER — BIOTENE DRY MOUTH MT LIQD: 15.0000 mL | OROMUCOSAL | Status: DC | PRN

## 2020-02-16 NOTE — Progress Notes (Addendum)
Patient seen again at bedside, in Trendelenburg, appears to be dying.  Discussed with daughter and multiple family members at bedside.  Daughter requests full comfort care, focus on comfort only, stop antibiotics and other treatments.  Discussed with Dr. Chestine Spore, we are in agreement, full comfort care, will stop remdesivir, antibiotics and pressor.  Fentanyl drip.  Change to medical bed.  [facility] will assume care.  Expect death next 24 hours.  Septic shock secondary to Covid, volume depletion Suspected aspiration  AKI ATN Diarrhea Macrocytic anemia Thrombocytopenia Proximal femur fracture ruled out Fall at home Wrist fracture  Brendia Sacks, MD [facility]

## 2020-02-16 NOTE — Consult Note (Signed)
Reason for Consult:left hp injury after fall Referring Physician: Brendia SacksGoodrich, Daniel MD  Kelle DartingMarion Mcdaniel is an 87 y.o. male.  HPI: 87 yo m presented to ED after fall at home yesterday resulting in inability to weight bear and complaints of left hip pain. 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 Covid + but asymptomatic.  Past Medical History:  Diagnosis Date  . HTN (hypertension)   . Hyperlipidemia     Past Surgical History:  Procedure Laterality Date  . PERMANENT PACEMAKER INSERTION    . REPLACEMENT TOTAL KNEE Left   . TOTAL HIP ARTHROPLASTY Bilateral     Family History  Family history unknown: Yes    Social History:  reports that he has never smoked. He does not have any smokeless tobacco history on file. He reports previous alcohol use. No history on file for drug use.  Allergies:  Allergies  Allergen Reactions  . Ambien [Zolpidem] Other (See Comments)    Became violent    Medications: I have reviewed the patient's current medications.  Results for orders placed or performed during the hospital encounter of 02/29/2020 (from the past 48 hour(s))  Lactic acid, plasma     Status: Abnormal   Collection Time: 02/16/2020  2:36 PM  Result Value Ref Range   Lactic Acid, Venous 2.1 (HH) 0.5 - 1.9 mmol/L    Comment: CRITICAL RESULT CALLED TO, READ BACK BY AND VERIFIED WITH: LIZ MEEKS RN.@[email or web address]  ON 1.8.22 BY TCALDWELL MT. Performed at [facility], [address] [place], [place] [address]   Basic metabolic panel     Status: Abnormal   Collection Time: 03/08/2020  2:44 PM  Result Value Ref Range   Sodium 138 135 - 145 mmol/L   Potassium 5.6 (H) 3.5 - 5.1 mmol/L   Chloride 112 (H) 98 - 111 mmol/L   CO2 13 (L) 22 - 32 mmol/L   Glucose, Bld 62 (L) 70 - 99 mg/dL    Comment: Glucose reference range applies only to samples taken after fasting for at least 8 hours.   BUN 71 (H) 8 - 23 mg/dL   Creatinine, Ser 6.043.98 (H) 0.61 - 1.24 mg/dL   Calcium  8.7 (L) 8.9 - 10.3 mg/dL   GFR, Estimated 13 (L) >60 mL/min    Comment: (NOTE) Calculated using the CKD-EPI Creatinine Equation (2021)    Anion gap 13 5 - 15    Comment: Performed at [facility], [address] [place], [place] [address]  CBC     Status: Abnormal   Collection Time: 02/16/2020  2:44 PM  Result Value Ref Range   WBC 10.6 (H) 4.0 - 10.5 K/uL   RBC 2.64 (L) 4.22 - 5.81 MIL/uL   Hemoglobin 8.7 (L) 13.0 - 17.0 g/dL   HCT 11.927.9 (L) 14.739.0 - 82.952.0 %   MCV 105.7 (H) 80.0 - 100.0 fL   MCH 33.0 26.0 - 34.0 pg   MCHC 31.2 30.0 - 36.0 g/dL   RDW 56.215.0 13.011.5 - 86.515.5 %   Platelets 95 (L) 150 - 400 K/uL    Comment: REPEATED TO VERIFY PLATELET COUNT CONFIRMED BY SMEAR Immature Platelet Fraction may be clinically indicated, consider ordering this additional test [id number]    nRBC 0.0 0.0 - 0.2 %    Comment: Performed at [facility], [address] [place], [place] [address]  ABO/Rh     Status: None   Collection Time: 02/10/2020  2:44 PM  Result Value Ref Range   ABO/RH(D)      A POS Performed at [facility], [address] [place], [place] [address]   Troponin I (High Sensitivity)     Status: Abnormal   Collection Time: 09-10-2020  4:08 PM  Result Value Ref Range   Troponin I (High Sensitivity) 85 (H) <18 ng/L    Comment: (NOTE) Elevated high sensitivity troponin I (hsTnI) values and significant  changes across serial measurements may suggest ACS but many other  chronic and acute conditions are known to elevate hsTnI [facility]ulTem[facility]ula [facility].  Refer to the "Links" section for chest pain algorithms and additional  guidance. Performed at [facility], [address] [place], [place] [address]   Magnesium     Status: None   Collection Time: 09-10-2020  4:08 PM  Result Value Ref Range   Magnesium 2.2 1.7 - 2.4 mg/dL    Comment: Performed at [facility], [address] [place], [place] [address]  Protime-INR     Status: Abnormal   Collection Time: 09-10-2020  4:08  PM  Result Value Ref Range   Prothrombin Time 19.4 (H) 11.4 - 15.2 seconds   INR 1.7 (H) 0.8 - 1.2    Comment: (NOTE) INR goal varies based on device and disease states. Performed at [facility], [address] [place], [place] [address]   APTT     Status: Abnormal   Collection Time: 09-10-2020  4:08 PM  Result Value Ref Range   aPTT 42 (H) 24 - 36 seconds    Comment:        IF BASELINE aPTT IS ELEVATED, SUGGEST PATIENT RISK ASSESSMENT BE USED TO DETERMINE APPROPRIATE ANTICOAGULANT THERAPY. Performed at [facility], [address] [place], [place] [address]   Hepatic function panel     Status: Abnormal   Collection Time: 09-10-2020  4:08 PM  Result Value Ref Range   Total Protein 6.4 (L) 6.5 - 8.1 g/dL   Albumin 2.7 (L) 3.5 - 5.0 g/dL   AST 39 15 - 41 U/L   ALT 19 0 - 44 U/L   Alkaline Phosphatase 128 (H) 38 - 126 U/L   Total Bilirubin 1.1 0.3 - 1.2 mg/dL   Bilirubin, Direct 0.5 (H) 0.0 - 0.2 mg/dL   Indirect Bilirubin 0.6 0.3 - 0.9 mg/dL    Comment: Performed at [facility], [address] [place], [place] [address]  POC occult blood, ED     Status: Abnormal   Collection Time: 09-10-2020  4:23 PM  Result Value Ref Range   Fecal Occult Bld POSITIVE (A) NEGATIVE  Troponin I (High Sensitivity)     Status: Abnormal   Collection Time: 09-10-2020  5:15 PM  Result Value Ref Range   Troponin I (High Sensitivity) 77 (H) <18 ng/L    Comment: (NOTE) Elevated high sensitivity troponin I (hsTnI) values and significant  changes across serial measurements may suggest ACS but many other  chronic and acute conditions are known to elevate hsTnI results.  Refer to the "Links" section for chest pain algorithms and additional  guidance. Performed at [facility], [address] [place], [place] [address]   Lactic acid, plasma     Status: None   Collection Time: 09-10-2020  5:21 PM  Result Value Ref Range   Lactic Acid, Venous 1.8 0.5 - 1.9 mmol/L    Comment:  Performed at [facility], [address] [place], [place] [address]  Resp Panel by RT-PCR (Flu A&B, Covid) Nasopharyngeal Swab     Status: Abnormal   Collection Time: March 16, 2020  5:21 PM   Specimen: Nasopharyngeal Swab; Nasopharyngeal(NP) swabs in vial transport medium  Result Value Ref Range   SARS Coronavirus 2 by RT PCR POSITIVE (A) NEGATIVE    Comment: RESULT CALLED TO, READ BACK BY AND VERIFIED WITH: RN BRENDA M. [phone] FCP (NOTE) SARS-CoV-2 target nucleic acids are DETECTED.  The SARS-CoV-2 RNA is generally detectable in upper respiratory specimens during the acute phase of infection. Positive results are indicative of the presence of the identified virus, but do not rule out bacterial infection or co-infection with other pathogens not detected by the test. Clinical correlation with patient history and other diagnostic information is necessary to determine patient infection status. The expected result is Negative.  Fact Sheet for Patients: BloggerCourse.com  Fact Sheet for Healthcare Providers: SeriousBroker.it  This test is not yet approved or cleared by the [place] FDA and  has been authorized for detection and/or diagnosis of SARS-CoV-2 by FDA under an Emergency Use Authorization (EUA).  This EUA will remain in effect (meaning this test can be used ) for the duration of  the COVID-19 declaration under Section 564(b)(1) of the Act, 21 U.S.C. section 360bbb-3(b)(1), unless the authorization is terminated or revoked sooner.     Influenza A by PCR NEGATIVE NEGATIVE   Influenza B by PCR NEGATIVE NEGATIVE    Comment: (NOTE) The Xpert Xpress SARS-CoV-2/FLU/RSV plus assay is intended as an aid in the diagnosis of influenza from Nasopharyngeal swab specimens and should not be used as a sole basis for treatment. Nasal washings and aspirates are unacceptable for Xpert Xpress SARS-CoV-2/FLU/RSV testing.  Fact  Sheet for Patients: BloggerCourse.com  Fact Sheet for Healthcare Providers: SeriousBroker.it  This test is not yet approved or cleared by the [place] FDA and has been authorized for detection and/or diagnosis of SARS-CoV-2 by FDA under an Emergency Use Authorization (EUA). This EUA will remain in effect (meaning this test can be used) for the duration of the COVID-19 declaration under Section 564(b)(1) of the Act, 21 U.S.C. section 360bbb-3(b)(1), unless the authorization is terminated or revoked.  Performed at [facility], [address] [place] [address] [place], [place] [address]   Type and screen [facility]     Status: None (Preliminary result)   Collection Time: 16-Mar-2020  5:35 PM  Result Value Ref Range   ABO/RH(D) A POS    Antibody Screen NEG    Sample Expiration 02/18/2020,2359    Unit Number [id number]    Blood Component Type RED CELLS,LR    Unit division 00    Status of Unit ALLOCATED    Transfusion Status OK TO TRANSFUSE    Crossmatch Result Compatible    Unit Number [id number]    Blood Component Type RED CELLS,LR    Unit division 00    Status of Unit ALLOCATED    Transfusion Status OK TO TRANSFUSE    Crossmatch Result      Compatible Performed at [facility], [address] [place], [place] [address]   I-Stat venous blood gas, [facility] ED only)     Status: Abnormal   Collection Time: March 16, 2020  5:47 PM  Result Value Ref Range   pH, Ven 7.294 7.250 - 7.430   pCO2, Ven 27.6 (L) 44.0 - 60.0 mmHg   pO2, Ven 57.0 (H) 32.0 - 45.0 mmHg  Bicarbonate 13.4 (L) 20.0 - 28.0 mmol/L   TCO2 14 (L) 22 - 32 mmol/L   O2 Saturation 87.0 %   Acid-base deficit 12.0 (H) 0.0 - 2.0 mmol/L   Sodium 142 135 - 145 mmol/L   Potassium 5.3 (H) 3.5 - 5.1 mmol/L   Calcium, Ion 1.07 (L) 1.15 - 1.40 mmol/L   HCT 23.0 (L) 39.0 - 52.0 %   Hemoglobin 7.8 (L) 13.0 - 17.0 g/dL   Sample type VENOUS   Prepare RBC  (crossmatch)     Status: None   Collection Time: 2020-03-03  8:00 PM  Result Value Ref Range   Order Confirmation      ORDER PROCESSED BY BLOOD BANK Performed at [facility], [address] [place], [place] [address]   Urinalysis, Routine w reflex microscopic Urine, Catheterized     Status: Abnormal   Collection Time: March 03, 2020  8:17 PM  Result Value Ref Range   Color, Urine AMBER (A) YELLOW    Comment: BIOCHEMICALS MAY BE AFFECTED BY COLOR   APPearance HAZY (A) CLEAR   Specific Gravity, Urine 1.021 1.005 - 1.030   pH 5.0 5.0 - 8.0   Glucose, UA NEGATIVE NEGATIVE mg/dL   Hgb urine dipstick NEGATIVE NEGATIVE   Bilirubin Urine NEGATIVE NEGATIVE   Ketones, ur NEGATIVE NEGATIVE mg/dL   Protein, ur 30 (A) NEGATIVE mg/dL   Nitrite NEGATIVE NEGATIVE   Leukocytes,Ua NEGATIVE NEGATIVE   RBC / HPF 0-5 0 - 5 RBC/hpf   WBC, UA 6-10 0 - 5 WBC/hpf   Bacteria, UA NONE SEEN NONE SEEN   Squamous Epithelial / LPF 0-5 0 - 5   Hyaline Casts, UA PRESENT     Comment: Performed at [facility], [address] [place], [place] [address]  SARS CORONAVIRUS 2 (TAT 6-24 HRS) Nasopharyngeal Nasopharyngeal Swab     Status: Abnormal   Collection Time: 03-Mar-2020  8:17 PM   Specimen: Nasopharyngeal Swab  Result Value Ref Range   SARS Coronavirus 2 POSITIVE (A) NEGATIVE    Comment: (NOTE) SARS-CoV-2 target nucleic acids are DETECTED.  The SARS-CoV-2 RNA is generally detectable in upper and lower respiratory specimens during the acute phase of infection. Positive results are indicative of the presence of SARS-CoV-2 RNA. Clinical correlation with patient history and other diagnostic information is  necessary to determine patient infection status. Positive results do not rule out bacterial infection or co-infection with other viruses.  The expected result is Negative.  Fact Sheet for Patients: HairSlick.no  Fact Sheet for Healthcare  Providers: quierodirigir.com  This test is not yet approved or cleared by the [place] FDA and  has been authorized for detection and/or diagnosis of SARS-CoV-2 by FDA under an Emergency Use Authorization (EUA). This EUA will remain  in effect (meaning this test can be used) for the duration of the COVID-19 declaration under Section 564(b)(1) of the Act, 21 U. S.C. section 360bbb-3(b)(1), unless the authorization is terminated or revoked sooner.   Performed at [facility], [address], [place], [place] [address]   CBC     Status: Abnormal   Collection Time: 2020/03/03  8:17 PM  Result Value Ref Range   WBC 9.1 4.0 - 10.5 K/uL   RBC 2.59 (L) 4.22 - 5.81 MIL/uL   Hemoglobin 8.5 (L) 13.0 - 17.0 g/dL   HCT 96.7 (L) 89.3 - 81.0 %   MCV 102.7 (H) 80.0 - 100.0 fL   MCH 32.8 26.0 - 34.0 pg  MCHC 32.0 30.0 - 36.0 g/dL   RDW 16.1 09.6 - 04.5 %   Platelets 97 (L) 150 - 400 K/uL    Comment: Immature Platelet Fraction may be clinically indicated, consider ordering this additional test [id number] CONSISTENT WITH PREVIOUS RESULT    nRBC 0.0 0.0 - 0.2 %    Comment: Performed at [facility], [address] [place], [place] [address]  Basic metabolic panel     Status: Abnormal   Collection Time: 02/26/2020  8:17 PM  Result Value Ref Range   Sodium 139 135 - 145 mmol/L   Potassium 4.9 3.5 - 5.1 mmol/L   Chloride 112 (H) 98 - 111 mmol/L   CO2 16 (L) 22 - 32 mmol/L   Glucose, Bld 106 (H) 70 - 99 mg/dL    Comment: Glucose reference range applies only to samples taken after fasting for at least 8 hours.   BUN 72 (H) 8 - 23 mg/dL   Creatinine, Ser 5.62 (H) 0.61 - 1.24 mg/dL   Calcium 8.0 (L) 8.9 - 10.3 mg/dL   GFR, Estimated 14 (L) >60 mL/min    Comment: (NOTE) Calculated using the CKD-EPI Creatinine Equation (2021)    Anion gap 11 5 - 15    Comment: Performed at [facility], [address] [place], [place] [address]  Vitamin B12      Status: Abnormal   Collection Time: 03/06/2020  8:17 PM  Result Value Ref Range   Vitamin B-12 1,032 (H) 180 - 914 pg/mL    Comment: (NOTE) This assay is not validated for testing neonatal or myeloproliferative syndrome specimens for Vitamin B12 levels. Performed at [facility], [address] [place], [place] [address]   Folate     Status: None   Collection Time: 03/01/2020  8:17 PM  Result Value Ref Range   Folate 8.8 >5.9 ng/mL    Comment: Performed at [facility], [address] [place], [place] [address]  Iron and TIBC     Status: Abnormal   Collection Time: 02/26/2020  8:17 PM  Result Value Ref Range   Iron 15 (L) 45 - 182 ug/dL   TIBC 284 (L) 132 - 440 ug/dL   Saturation Ratios 6 (L) 17.9 - 39.5 %   UIBC 220 ug/dL    Comment: Performed at [facility], [address], [place], [place] [address]  Ferritin     Status: None   Collection Time: 02/10/2020  8:17 PM  Result Value Ref Range   Ferritin 270 24 - 336 ng/mL    Comment: Performed at [facility], [address] [place], [place] [address]  Reticulocytes     Status: Abnormal   Collection Time: 03/09/2020  8:17 PM  Result Value Ref Range   Retic Ct Pct 0.8 0.4 - 3.1 %   RBC. 2.59 (L) 4.22 - 5.81 MIL/uL   Retic Count, Absolute 21.5 19.0 - 186.0 K/uL   Immature Retic Fract 13.1 2.3 - 15.9 %    Comment: Performed at [facility], [address], [place], [place] [address]  Resp Panel by RT-PCR (Flu A&B, Covid)     Status: Abnormal   Collection Time: 02/11/2020 10:07 PM  Result Value Ref Range   SARS Coronavirus 2 by RT PCR POSITIVE (A) NEGATIVE    Comment: RESULT CALLED TO, READ BACK BY AND VERIFIED WITH: RN Inetta Fermo [facility] AT 2308 BY MESSAN H. ON 02/15/2020 (NOTE) SARS-CoV-2  target nucleic acids are DETECTED.  The SARS-CoV-2 RNA is generally detectable in upper respiratory specimens during the acute phase of infection. Positive results are indicative of the presence of the identified virus, but  do not rule out bacterial infection or co-infection with other pathogens not detected by the test. Clinical correlation with patient history and other diagnostic information is necessary to determine patient infection status. The expected result is Negative.  Fact Sheet for Patients: BloggerCourse.com  Fact Sheet for Healthcare Providers: SeriousBroker.it  This test is not yet approved or cleared by the [place] FDA and  has been authorized for detection and/or diagnosis of SARS-CoV-2 by FDA under an Emergency Use Authorization (EUA).  This EUA will remain in effect (meaning  this test can be used) for the duration of  the COVID-19 declaration under Section 564(b)(1) of the Act, 21 U.S.C. section 360bbb-3(b)(1), unless the authorization is terminated or revoked sooner.     Influenza A by PCR NEGATIVE NEGATIVE   Influenza B by PCR NEGATIVE NEGATIVE    Comment: (NOTE) The Xpert Xpress SARS-CoV-2/FLU/RSV plus assay is intended as an aid in the diagnosis of influenza from Nasopharyngeal swab specimens and should not be used as a sole basis for treatment. Nasal washings and aspirates are unacceptable for Xpert Xpress SARS-CoV-2/FLU/RSV testing.  Fact Sheet for Patients: BloggerCourse.com  Fact Sheet for Healthcare Providers: SeriousBroker.it  This test is not yet approved or cleared by the [place] FDA and has been authorized for detection and/or diagnosis of SARS-CoV-2 by FDA under an Emergency Use Authorization (EUA). This EUA will remain in effect (meaning this test can be used) for the duration of the COVID-19 declaration under Section 564(b)(1) of the Act, 21 U.S.C. section 360bbb-3(b)(1), unless the authorization is terminated or revoked.  Performed at [facility], [address] [place], [place] [address]   CBC     Status: Abnormal   Collection Time:  02/16/20  5:50 AM  Result Value Ref Range   WBC 7.0 4.0 - 10.5 K/uL   RBC 2.69 (L) 4.22 - 5.81 MIL/uL   Hemoglobin 9.0 (L) 13.0 - 17.0 g/dL   HCT 60.4 (L) 54.0 - 98.1 %   MCV 100.0 80.0 - 100.0 fL   MCH 33.5 26.0 - 34.0 pg   MCHC 33.5 30.0 - 36.0 g/dL   RDW 19.1 47.8 - 29.5 %   Platelets 85 (L) 150 - 400 K/uL    Comment: REPEATED TO VERIFY SPECIMEN CHECKED FOR CLOTS Immature Platelet Fraction may be clinically indicated, consider ordering this additional test [id number] CONSISTENT WITH PREVIOUS RESULT    nRBC 0.0 0.0 - 0.2 %    Comment: Performed at [facility], [address] [place], [place] [address]  Renal function panel     Status: Abnormal   Collection Time: 02/16/20  5:50 AM  Result Value Ref Range   Sodium 141 135 - 145 mmol/L   Potassium 5.0 3.5 - 5.1 mmol/L   Chloride 110 98 - 111 mmol/L   CO2 16 (L) 22 - 32 mmol/L   Glucose, Bld 124 (H) 70 - 99 mg/dL    Comment: Glucose reference range applies only to samples taken after fasting for at least 8 hours.   BUN 73 (H) 8 - 23 mg/dL   Creatinine, Ser 6.29 (H) 0.61 - 1.24 mg/dL   Calcium 7.9 (L) 8.9 - 10.3 mg/dL   Phosphorus 5.7 (H) 2.5 - 4.6 mg/dL   Albumin 2.4 (L) 3.5 -  5.0 g/dL   GFR, Estimated 14 (L) >60 mL/min    Comment: (NOTE) Calculated using the CKD-EPI Creatinine Equation (2021)    Anion gap 15 5 - 15    Comment: Performed at [facility], [address] [place], [place] [address]    CT Abdomen Pelvis Wo Contrast  Result Date: 03/07/20 CLINICAL DATA:  Witnessed fall.  Diarrhea.  Abdominal pain.  Sepsis. EXAM: CT ABDOMEN AND PELVIS WITHOUT CONTRAST TECHNIQUE: Multidetector CT imaging of the abdomen and pelvis was performed following the standard protocol without IV contrast. COMPARISON:  None. FINDINGS: Lower chest: Moderate size right effusion layering dependently. Dependent atelectasis in the right lung. The heart is enlarged. Pacemaker defibrillator in place. Hepatobiliary: No focal liver  lesions seen. Question cirrhosis. No calcified gallstones. Pancreas: No pancreatic lesion evident on this study done without contrast. Spleen: Negative Adrenals/Urinary Tract: Adrenal glands are normal. Kidneys are normal. Bladder is normal, though not well seen because of streak artifact from hip replacements. Stomach/Bowel: Small amount of ascites distributed freely. No focal small bowel abnormality seen. Liquid stool in the colon. Diverticulosis without visible diverticulitis. Vascular/Lymphatic: Aortic atherosclerosis. No aneurysm. IVC is normal. No adenopathy. Reproductive: Previous hysterectomy.  No pelvic mass. Other: Ascites as noted above. No free air. Right inguinal hernia containing fat. Musculoskeletal: Chronic spinal degenerative changes. No fracture or focal bone lesion. Bilateral hip replacements. IMPRESSION: 1. Moderate size right effusion layering dependently. Dependent atelectasis in the right lung. Cardiomegaly. Pacemaker defibrillator in place. 2. Small amount of ascites distributed freely. 3. Question cirrhosis. 4. Liquid stool in the colon. Diverticulosis without visible diverticulitis. 5. Aortic atherosclerosis. 6. Right inguinal hernia containing fat. 7. Consider subsequent scan with oral and intravenous contrast. Aortic Atherosclerosis (ICD10-I70.0). Electronically Signed   By: Paulina Fusi M.D.   On: 03/07/20 19:31   CT Head Wo Contrast  Result Date: Mar 07, 2020 CLINICAL DATA:  Witnessed fall today. EXAM: CT HEAD WITHOUT CONTRAST TECHNIQUE: Contiguous axial images were obtained from the base of the skull through the vertex without intravenous contrast. COMPARISON:  None. FINDINGS: Brain: Age related atrophy. No sign of old or acute focal infarction, mass lesion, hemorrhage, hydrocephalus or extra-axial collection. Vascular: There is atherosclerotic calcification of the major vessels at the base of the brain. Skull: No skull fracture. Sinuses/Orbits: See report of maxillofacial exam.  Other: None IMPRESSION: No acute or traumatic finding. Age related atrophy. Atherosclerotic calcification of the major vessels at the base of the brain. Electronically Signed   By: Paulina Fusi M.D.   On: Mar 07, 2020 19:17   CT Cervical Spine Wo Contrast  Result Date: March 07, 2020 CLINICAL DATA:  Witnessed fall with trauma to the head and neck. EXAM: CT CERVICAL SPINE WITHOUT CONTRAST TECHNIQUE: Multidetector CT imaging of the cervical spine was performed without intravenous contrast. Multiplanar CT image reconstructions were also generated. COMPARISON:  None. FINDINGS: Alignment: Abnormal alignment at the C1-2 level due to chronic arthropathy at the C1-2 articulation. I do not see evidence an acute fracture in that region. C1 is 6 mm posterior relative to C2 in there is stenosis of the spinal canal at the C1-2 level that could affect the spinal cord. Skull base and vertebrae: C1-2 arthropathy as described above. 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 Other arthritides could also affect this joint. No other fracture or focal lesion in the cervical region or upper thoracic region. Soft tissues and spinal canal: Vascular calcification. No other soft tissue finding of significance. Disc levels: Degenerative spondylosis from C2-3 through C6-7. Facet  osteoarthritis on the right from C2-3 through C5-6 and on the left at C2-3 and C3-4. Below the level of C1-2, no compressive central canal stenosis is suspected. Foraminal narrowing could be symptomatic on either side at C2-3, on the right at C3-4, on the right at C4-5, bilateral at C5-6 and bilateral at C6-7. Upper chest: Right pleural effusion layering dependently. Other: None IMPRESSION: 1. Advanced arthropathy at the C1-2 articulation with 6 mm of posterior displacement of C1 relative to C2. Stenosis of the spinal canal at this level that could affect the spinal cord. I do not see evidence of an acute fracture in that region. 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 Other arthritides could also affect this joint. 2. Degenerative spondylosis and facet osteoarthritis throughout the cervical region as outlined above. Foraminal narrowing could be symptomatic on either side at C2-3, on the right at C3-4, on the right at C4-5, bilateral at C5-6 and bilateral at C6-7. 3. Right pleural effusion layering dependently. Electronically Signed   By: Paulina Fusi M.D.   On: 2020-02-21 19:26   DG Pelvis Portable  Result Date: February 21, 2020 CLINICAL DATA:  Pain after fall EXAM: PORTABLE PELVIS 1-2 VIEWS COMPARISON:  None. FINDINGS: The patient is status post bilateral hip replacements. There is a fracture in the proximal left femur adjacent to the lateral proximal aspect of the femoral component on the left. The left superior and inferior pubic rami are not well assessed due to rotation. A fracture through the inferior pubic ramus is not excluded based on the femoral films. IMPRESSION: 1. Fracture of the proximal left femur adjacent to the lateral proximal aspect of the femoral component on the left. This appears to be acute. 2. Possible fracture through the left inferior pubic ramus based on the femoral film. This region is not well seen on the pelvis films. 3. Evaluation of the left superior and inferior pubic rami limited due to rotation on this study. Electronically Signed   By: Gerome Sam III M.D   On: 02/21/20 16:58   DG Chest Port 1 View  Result Date: 02-21-20 CLINICAL DATA:  Pain after fall EXAM: PORTABLE CHEST 1 VIEW COMPARISON:  None. FINDINGS: Cardiomegaly. Patient has an AICD. The hila and mediastinum are normal. Prominent skin folds are seen over the right side of the chest. No pneumothorax. No nodules or masses. No focal infiltrates. No overt edema. IMPRESSION: No active disease. Electronically Signed   By: Gerome Sam III M.D   On: 02/21/20 16:53   DG Femur Portable Min 2 Views Left  Result Date: 02/21/2020 CLINICAL DATA:  Pain after fall EXAM:  LEFT FEMUR PORTABLE 2 VIEWS COMPARISON:  None. FINDINGS: The patient is status post left hip replacement. The acetabular and femoral components are in good position. No dislocation. There is a fracture through the proximal femur adjacent to the proximal aspect of the femoral component with mild displacement. This has an acute appearance. The remainder of the femur is intact. The patient is status post knee replacement as well. On limited views, the knee hardware is in good position. Vascular calcifications. IMPRESSION: 1. Mildly displaced fracture through the proximal femur adjacent to the proximal aspect of the femoral component of the hip replacement. This appears to be acute. No other fractures. No dislocation. 2. Atherosclerotic calcifications in the aorta. Electronically Signed   By: Gerome Sam III M.D   On: 02-21-2020 16:56   CT Maxillofacial WO CM  Result Date: 02/21/2020 CLINICAL DATA:  Fall with facial trauma.  EXAM: CT MAXILLOFACIAL WITHOUT CONTRAST TECHNIQUE: Multidetector CT imaging of the maxillofacial structures was performed. Multiplanar CT image reconstructions were also generated. COMPARISON:  None. FINDINGS: Osseous: No facial fracture.  See below regarding sinus disease. Orbits: No acute or intrinsic orbital pathology. Orbital floor on the left is up lifted by the maxillary sinus process described below. Sinuses: Complete opacification the paranasal sinuses, sparing only the right division of the sphenoid sinus. Left maxillary sinus is markedly expanded and there is adjacent opacification of the left nasal passages. Process extends into the ethmoid region. The differential diagnosis in this case is that of chronic sinus inflammatory disease with mucocele formation versus the possibility of an underlying mass lesion such as antro choanal polyp or other sinus tumor. Soft tissues: Mild soft tissue swelling of the forehead region. No intraorbital soft tissue injury. Limited intracranial:  Negative IMPRESSION: 1. No facial fracture. 2. Complete opacification of the paranasal sinuses, sparing only the right division of the sphenoid sinus. Left maxillary sinus is markedly expanded and there is adjacent opacification of the left nasal passages. The differential diagnosis in this case is that of chronic sinus inflammatory disease with mucocele formation versus the possibility of an underlying mass lesion such as antro choanal polyp or other sinus tumor. Electronically Signed   By: Paulina Fusi M.D.   On: 12-Mar-2020 19:21    Review of Systems Blood pressure (!) 157/144, pulse 69, temperature (!) 97.3 F (36.3 C), temperature source Oral, resp. rate 16, height 5\' 5"  (1.651 m), weight 61.2 kg, SpO2 97 %. Physical Exam HENT:     Head: Normocephalic.     Mouth/Throat:     Pharynx: Oropharynx is clear.  Eyes:     Extraocular Movements: Extraocular movements intact.  Cardiovascular:     Pulses: Normal pulses.  Pulmonary:     Effort: Pulmonary effort is normal.  Musculoskeletal:     Cervical back: Normal range of motion.     Comments: Bilateral hips with no ecchymosis/swelling Able to perform passive ROM of left and right hip and knee without increased pain nontender to palpation bilateral hip/greater troch nontender to compression of pelvis Lower extremities of equal length and no rotation, distally NVI Bilateral shoulder ROM passively without pain or limitation L arm is well fitted cast without skin compromise or distal swelling  Neurological:     Mental Status: He is alert.     Assessment/Plan: Concern for left femur periprostheic fracture and remus fracture- xrays showing poss mildly displaced prox femur fracture adjacent to the prox aspect of the femoral component of the hip replacement, noncontrast CT of pelvis confirms no acute pelvic or femoral fracture or injury. This correlated with his exam today of no localized pelvic or hip pain with ROM or palpation. We believe xray  findings reflect chronic and not acute bony changes with intact/well fixed total hip arthroplasty. Therefore, WBAT L hip No concern for other orthopedic injury at this time.  Jiles Harold 02/16/2020, 10:07 AM

## 2020-02-16 NOTE — Consult Note (Signed)
NAME:  Derek Mcdaniel, MRN:  [id number], DOB:  1921/12/04, LOS: 1 ADMISSION DATE:  03-02-20, CONSULTATION DATE:  02/16/19 REFERRING MD:  Irene Limbo, TRH, CHIEF COMPLAINT:  hypotension  Brief History:  Covid, diarrhea x 2 weeks, fall > hip fracture, AKI, hypernatremia, now in respiratory failure and shock with encephalopathy due to CAP  History of Present Illness:  Derek Mcdaniel is a 87 y/o gentleman with a history of a recent fall and left wrist fracture who presented after another witnessed fall. He was found to have a hip fracture and was incidentally noted to have covid. He has been having diarrhea x the past 2 weeks. He was mentating normally in the ED yesterday, but today has become confused and hypotensive. CCM consulted for hypotension requiring vasopressors.  Per his daughter he has previously been on MV, and he made her swear not to do that again. He has been DNR since admission given the list of comorbidities he has accumulated.  Past Medical History:  HTN HLD Wrist fracture on L Permanent pacemaker  Significant Hospital Events:  Admitted 1/8 Started on pressors 1/9  Consults:  PCCM  Procedures:    Significant Diagnostic Tests:  Renal US> ascites CT abd/ pelvis> R pleural effusion, ascites, possibly cirrhosis, CT maxillofacial> opacified paranasal sinuses CT head> age-appropriate atrophy X ray R femur> fracture  Micro Data:  covid +  Antimicrobials:  Ceftriaxone>  azithromycin>  Interim History / Subjective:  Confused, unable to answer questions.  Objective   Blood pressure 94/65, pulse 70, temperature 97.6 F (36.4 C), temperature source Axillary, resp. rate (!) 23, height 5\' 5"  (1.651 m), weight 61.2 kg, SpO2 95 %.        Intake/Output Summary (Last 24 hours) at 02/16/2020 1356 Last data filed at 02/16/2020 1227 Gross per 24 hour  Intake 1500.33 ml  Output --  Net 1500.33 ml   Filed Weights   Mar 02, 2020 1526  Weight: 61.2 kg     Examination: General: Critically ill appearing man laying in bed HENT: [place]/AT, eyes anicteric, mild conjunctival injection. Upper airway secretions rattling with breathing. Lungs: rhonchi bilaterally, purulent tan secretions removed with NTS Cardiovascular: RRR, paced. Pacemaker overlying left chest without erythema.  Abdomen: Soft, NT, ND Extremities: Pitting edema, left wrist in cast. Neuro: Confused, not opening eyes spontaneously. PERRL. GU: Foley  Resolved Hospital Problem list     Assessment & Plan:   Septic shock due to CAP, Covid 19 viral pneumonia -additional IVF -ceftriaxone, azithromycin  -Vasopressors to continue as ordered; no titration based on GoC discussion with daughter.  -remdesivir; hold off on steroids  Hip fracture, mechanical fall due to frailty from viral infection Recent wrist fracture -pain control -appreciate ortho's assistance  AKI, likely pre-renal from diarrhea, possibly ATN now Lactic acidosis Acute metabolic acidosis Hyperphosphatemia -con't bicarb infusion -additional 1L LR -strict I/Os  Anemia due to iron deficiency- probably chronic -transfuse for Hb<7 or hemodynamically significant bleeding  Acute metabolic encephalopathy -supportive care  GoC -Very grim prognosis given advanced age and frailty. Discussion with daughter 04/14/20, who understands how ill her father is. I related to her that I think he is likely going to pass away in the next day, and she agreed that we should focus on comfort and family visitation. She will be coming to visit and has out of town family she is calling. We will con't pressors now to give family time to get here, but we will not titrate up past 22mcg/min. No escalation in care.   Best  practice (evaluated daily)   Disposition: ICU  Goals of Care:  Last date of multidisciplinary goals of care discussion: 1/9 Family and staff present: phone conversation with daughter Summary of discussion: end of  life comfort care Follow up goals of care discussion due:  Code Status: DNR  Labs   CBC: Recent Labs  Lab 02/21/2020 1444 02/18/2020 1747 02/14/2020 2017 02/16/20 0550 02/16/20 1228 02/16/20 1242  WBC 10.6*  --  9.1 7.0  --   --   HGB 8.7* 7.8* 8.5* 9.0* 9.2* 9.2*  HCT 27.9* 23.0* 26.6* 26.9* 27.0* 27.0*  MCV 105.7*  --  102.7* 100.0  --   --   PLT 95*  --  97* 85*  --   --     Basic Metabolic Panel: Recent Labs  Lab 03/02/2020 1444 03/05/2020 1608 02/14/2020 1747 03/07/2020 2017 02/16/20 0550 02/16/20 1228 02/16/20 1242  NA 138  --  142 139 141 142 143  K 5.6*  --  5.3* 4.9 5.0 4.5 4.5  CL 112*  --   --  112* 110  --   --   CO2 13*  --   --  16* 16*  --   --   GLUCOSE 62*  --   --  106* 124*  --   --   BUN 71*  --   --  72* 73*  --   --   CREATININE 3.98*  --   --  3.81* 3.88*  --   --   CALCIUM 8.7*  --   --  8.0* 7.9*  --   --   MG  --  2.2  --   --   --   --   --   PHOS  --   --   --   --  5.7*  --   --    GFR: Estimated Creatinine Clearance: 9.6 mL/min (A) (by C-G formula based on SCr of 3.88 mg/dL (H)). Recent Labs  Lab 02/25/2020 1436 02/13/2020 1444 03/08/2020 1721 02/10/2020 2017 02/16/20 0550  WBC  --  10.6*  --  9.1 7.0  LATICACIDVEN 2.1*  --  1.8  --   --     Liver Function Tests: Recent Labs  Lab 03/02/2020 1608 02/16/20 0550  AST 39  --   ALT 19  --   ALKPHOS 128*  --   BILITOT 1.1  --   PROT 6.4*  --   ALBUMIN 2.7* 2.4*   No results for input(s): LIPASE, AMYLASE in the last 168 hours. No results for input(s): AMMONIA in the last 168 hours.  ABG    Component Value Date/Time   PHART 7.383 02/16/2020 1242   PCO2ART 33.5 02/16/2020 1242   PO2ART 60 (L) 02/16/2020 1242   HCO3 20.0 02/16/2020 1242   TCO2 21 (L) 02/16/2020 1242   ACIDBASEDEF 5.0 (H) 02/16/2020 1242   O2SAT 90.0 02/16/2020 1242     Coagulation Profile: Recent Labs  Lab 03/09/2020 1608  INR 1.7*    Cardiac Enzymes: No results for input(s): CKTOTAL, CKMB, CKMBINDEX, TROPONINI in  the last 168 hours.  HbA1C: No results found for: HGBA1C  CBG: No results for input(s): GLUCAP in the last 168 hours.  Review of Systems:   Unable to be obtained due to mental status.  Past Medical History:  He,  has a past medical history of HTN (hypertension) and Hyperlipidemia.   Surgical History:   Past Surgical History:  Procedure Laterality Date  . PERMANENT PACEMAKER  INSERTION    . REPLACEMENT TOTAL KNEE Left   . TOTAL HIP ARTHROPLASTY Bilateral      Social History:   reports that he has never smoked. He does not have any smokeless tobacco history on file. He reports previous alcohol use.   Family History:  His Family history is unknown by patient.   Allergies Allergies  Allergen Reactions  . Ambien [Zolpidem] Other (See Comments)    Became violent     Home Medications  Prior to Admission medications   Medication Sig Start Date End Date Taking? Authorizing Provider  carvedilol (COREG) 25 MG tablet Take 25 mg by mouth 2 (two) times daily. 02/02/20  Yes [provider]  celecoxib (CELEBREX) 200 MG capsule Take 200 mg by mouth 2 (two) times daily. 02/02/20  Yes [provider]  furosemide (LASIX) 20 MG tablet Take 20 mg by mouth daily. 02/04/20  Yes [provider]  simvastatin (ZOCOR) 20 MG tablet Take 20 mg by mouth at bedtime. 02/02/20  Yes [provider]     Critical care time: 55 min.     Steffanie Dunn, DO 02/16/20 1:56 PM [facility]

## 2020-02-16 NOTE — ED Notes (Signed)
Able to reach [facility], rep will return call when complete

## 2020-02-16 NOTE — ED Notes (Signed)
Dr. Irene Limbo contacted concerning BP after fluid bolus (71/44 auto, 76/50 manual), new rhonchi and dyspnea, and poor mentation (does not respond appropriately to any orientation questions. New orders provided.

## 2020-02-16 NOTE — Hospital Course (Addendum)
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  Work-up revealed left proximal femur fracture, acute kidney injury, anemia, thrombocytopenia, hypotension, hypothermia, incidental COVID+.  Despite this, no signs or symptoms to suggest sepsis.  A & P  Acute kidney injury with mild hyperkalemia, nonanion gap metabolic acidosis --Prerenal pattern, urine output not recorded, no significant change in renal function at this point.  Suspect remains dry.  No signs or symptoms to suggest sepsis.  Concern for component of ATN secondary to hypotension. --Bolus IV fluids continue sodium bicarbonate.  CO2 is still low although anion gap has normalized.  Hypoglycemia corrected. --Trend BMP.  Check renal ultrasound.  Check urine studies.   Hypotension, hypothermia, diarrhea --Blood pressure higher last night but appears to have remained hypotensive.  Hypothermia corrected.  No diarrhea charted. --No leukocytosis or fever.  Hypotension looks to be secondary to dehydration.  Bolus IV fluids as above. --Rule out C. difficile.  Apparently no stool yet.  CT abdomen pelvis nonacute.   Macrocytic anemia, thrombocytopenia --Chronicity unclear, hemoglobin is stable and platelets are stable.  Suspect acute blood loss anemia superimposed on some chronic component.  Fecal occult blood positive but in the context of diarrhea probably hemorrhoidal.  No bleeding noted. --2 units on hold, no indication for transfusion at this time.   Fall at home --Deny complaints on admission.  Left femur fracture on plain films.  Possible pubic ramus fracture. --CT head, maxillofacial, spine, abdomen pelvis unremarkable except for below.   Proximal left femur fracture status post fall --Hip fracture protocol.  Orthopedics consultation pending.  Patient has no cardiac history, still  works and normally ambulates with a cane.  No history of chest pain or shortness of breath.  Advanced age is biggest risk factor, however would proceed with surgery w/o further medical workup, however note cervical spine CT: "Advanced arthropathy at the C1-2 articulation with 6 mm of posterior displacement of C1 relative to C2. Stenosis of the spinal canal at this level that could affect the spinal cord. I do not see evidence of an acute fracture in that region. 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 Other arthritides could also affect this joint." --discussed w/  Dr. Lovell Sheehan and advised on precautions/recs in regard to anesthesia for ortho surgery. Neutral intubation with glidoscope recommended.   Positive Covid test --Incidental finding, no evidence of respiratory compromise, asymptomatic.  Given advanced age, would treat with remdesivir 3 days but this is contraindicated secondary to renal function.  Steroids not indicated.   Wrist fracture from fall prior to admission --Cast just put on recently.  Follow-up as an outpatient.

## 2020-02-16 NOTE — Evaluation (Signed)
Clinical/Bedside Swallow Evaluation Patient Details  Name: Derek Mcdaniel MRN: [id number] Date of Birth: 04-06-1921  Today's Date: 02/16/2020 Time: SLP Start Time (ACUTE ONLY): 1020 SLP Stop Time (ACUTE ONLY): 1030 SLP Time Calculation (min) (ACUTE ONLY): 10 min  Past Medical History:  Past Medical History:  Diagnosis Date  . HTN (hypertension)   . Hyperlipidemia    Past Surgical History:  Past Surgical History:  Procedure Laterality Date  . PERMANENT PACEMAKER INSERTION    . REPLACEMENT TOTAL KNEE Left   . TOTAL HIP ARTHROPLASTY Bilateral    HPI:  87 year old man veteran [place], [place], presented after a fall and found to have multiple abnormalities including proximal left femur fracture, acute kidney injury with hyperkalemia, anemia, thrombocytopenia, hypotension and hypothermia. Pt dehydrated due to poor oral intake and diarrhea for the past two weeks.  Ortho consulatation is pending at the time of eval in ED. Per chart, "Positive Covid test--May be spurious.  The patient next-door reportedly is Covid positive diagnosed prior to admission, however was negative here in the emergency department.  Will retest, continue airborne precautions for now."   Assessment / Plan / Recommendation Clinical Impression  Pt seen in ED, appears delirious, unable to recognize cup or straw even with hand over hand cueing and verbal instruction. If PO is introduced to pts mouth with total assist cup sips or siphoning from a straw, pt will sense fluid and swallow. There are multiple swallows and delayed coughing concerning for possible aspiration events with any PO intake. Pt recommended to remain NPO until mentation is more appropriate, unless pt may be given comfort feeding. Will follow for needs. SLP Visit Diagnosis: Dysphagia, oropharyngeal phase (R13.12)    Aspiration Risk  Severe aspiration risk;Risk for inadequate nutrition/hydration    Diet Recommendation NPO;Alternative means  - temporary   Medication Administration: Via alternative means    Other  Recommendations     Follow up Recommendations Skilled Nursing facility      Frequency and Duration min 2x/week  2 weeks       Prognosis Prognosis for Safe Diet Advancement: Guarded Barriers to Reach Goals: Cognitive deficits      Swallow Study   General HPI: 87 year old man veteran [place], [place], presented after a fall and found to have multiple abnormalities including proximal left femur fracture, acute kidney injury with hyperkalemia, anemia, thrombocytopenia, hypotension and hypothermia. Pt dehydrated due to poor oral intake and diarrhea for the past two weeks.  Ortho consulatation is pending at the time of eval in ED. Per chart, "Positive Covid test--May be spurious.  The patient next-door reportedly is Covid positive diagnosed prior to admission, however was negative here in the emergency department.  Will retest, continue airborne precautions for now." Type of Study: Bedside Swallow Evaluation Previous Swallow Assessment: none Diet Prior to this Study: NPO Temperature Spikes Noted: No Respiratory Status: Nasal cannula History of Recent Intubation: No Behavior/Cognition: Distractible;Doesn't follow directions;Confused Oral Cavity Assessment: Dry Oral Care Completed by SLP: Yes Oral Cavity - Dentition: Adequate natural dentition Vision: Impaired for self-feeding Self-Feeding Abilities: Total assist Patient Positioning: Upright in bed Baseline Vocal Quality: Low vocal intensity Volitional Cough: Congested;Cognitively unable to elicit    Oral/Motor/Sensory Function Overall Oral Motor/Sensory Function: Generalized oral weakness   Ice Chips Ice chips: Impaired Presentation: Spoon Oral Phase Impairments: Poor awareness of bolus;Reduced labial seal Oral Phase Functional Implications: Prolonged oral transit Pharyngeal Phase Impairments: Cough - Delayed   Thin Liquid Thin Liquid:  Impaired  Presentation: Cup;Straw Oral Phase Impairments: Poor awareness of bolus;Reduced labial seal Pharyngeal  Phase Impairments: Multiple swallows;Suspected delayed Swallow;Cough - Immediate;Cough - Delayed    Nectar Thick Nectar Thick Liquid: Not tested   Honey Thick Honey Thick Liquid: Not tested   Puree Puree: Not tested   Solid     Solid: Not tested     Derek Ditty, MA CCC-SLP  Acute Rehabilitation Services Pager [phone] Office [phone]  Derek Mcdaniel 02/16/2020,1:14 PM

## 2020-02-16 NOTE — ED Notes (Signed)
Pt uses a [employer] ICD. Paged [phone] to facilitate turning this off for end of life

## 2020-02-16 NOTE — ED Notes (Signed)
Palliative paged per critical care MD

## 2020-02-16 NOTE — Progress Notes (Signed)
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  Work-up revealed left proximal femur fracture, acute kidney injury, anemia, thrombocytopenia, hypotension, hypothermia, incidental COVID+.  Despite this, no signs or symptoms to suggest sepsis.  A & P  Acute kidney injury with mild hyperkalemia, nonanion gap metabolic acidosis --Prerenal pattern, urine output not recorded, no significant change in renal function at this point.  Suspect remains dry.  No signs or symptoms to suggest sepsis.  Concern for component of ATN secondary to hypotension. --Bolus IV fluids continue sodium bicarbonate.  CO2 is still low although anion gap has normalized.  Hypoglycemia corrected. --Trend BMP.  Check renal ultrasound.  Check urine studies.   Hypotension, hypothermia, diarrhea --Blood pressure higher last night but appears to have remained hypotensive.  Hypothermia corrected.  No diarrhea charted. --No leukocytosis or fever.  Hypotension looks to be secondary to dehydration.  Bolus IV fluids as above. --Rule out C. difficile.  Apparently no stool yet.  CT abdomen pelvis nonacute.   Macrocytic anemia, thrombocytopenia --Chronicity unclear, hemoglobin is stable and platelets are stable.  Suspect acute blood loss anemia superimposed on some chronic component.  Fecal occult blood positive but in the context of diarrhea probably hemorrhoidal.  No bleeding noted. --2 units on hold, no indication for transfusion at this time.   Fall at home --Deny complaints on admission.  Left femur fracture on plain films.  Possible pubic ramus fracture. --CT head, maxillofacial, spine, abdomen pelvis unremarkable except for below.   Proximal left femur  fracture status post fall --Hip fracture protocol.  Orthopedics consultation pending.  Patient has no cardiac history, still works and normally ambulates with a cane.  No history of chest pain or shortness of breath.  Advanced age is biggest risk factor, however would proceed with surgery w/o further medical workup, however note cervical spine CT: "Advanced arthropathy at the C1-2 articulation with 6 mm of posterior displacement of C1 relative to C2. Stenosis of the spinal canal at this level that could affect the spinal cord. I do not see evidence of an acute fracture in that region. 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 Other arthritides could also affect this joint." --discussed w/  Dr. Lovell Sheehan and advised on precautions/recs in regard to anesthesia for ortho surgery. Neutral intubation with glidoscope recommended.   Positive Covid test --Incidental finding, no evidence of respiratory compromise, asymptomatic.  Given advanced age, would treat with remdesivir 3 days but this is contraindicated secondary to renal function.  Steroids not indicated.   Wrist fracture from fall prior to admission --Cast just put on recently.  Follow-up as an outpatient.   Disposition Plan:  Discussion: Critically ill with persistent hypotension, acute kidney injury unclear if oliguric, complicated by femur fracture, anemia, thrombocytopenia and Covid positive.  Prognosis remains guarded. Bolus IVF, if remains hypotensive may need pressor.  Status is: Inpatient  Remains inpatient appropriate because:IV treatments appropriate due to intensity of illness or inability to take PO and Inpatient level of care appropriate due to severity of illness   Dispo: The patient is from: Home              Anticipated d/c is to: TBD              Anticipated d/c date is: >  3 days              Patient currently is not medically stable to d/c.  DVT prophylaxis: SCDs Start: 03/09/2020 1956   Code Status: DNR Family Communication:  called dgt and updated  Brendia Sacks, MD  [facility] Direct contact: see [email or web address] (further directions at bottom of note if needed) 7PM-7AM contact night coverage as at bottom of note 02/16/2020, 9:56 AM  LOS: 1 day   Significant Hospital Events   .    Consults:  .    Procedures:  .   Significant Diagnostic Tests:  Marland Kitchen    Micro Data:  .    Antimicrobials:  .   Interval History/Subjective  CC: f/u AKI  Agitated and confused overnight, treated w/ Ativan. This AM resting, sleeping  Objective   Vitals:  Vitals:   02/16/20 0700 02/16/20 0801  BP: (!) 83/53   Pulse: 69   Resp: (!) 26   Temp:  (!) 97.3 F (36.3 C)  SpO2: 99%     Exam:  Constitutional:   . Appears calm and comfortable, sleeping Respiratory:  . CTA bilaterally, no w/r/r.  . Respiratory effort normal. No retractions or accessory muscle use Cardiovascular:  . RRR, no m/r/g . No LE extremity edema   Abdomen:  . Distended, soft, tympanic, no pain . No hernias noted Musculoskeletal:  . RLE, LLE skin as below Skin:  . No rashes, lesions, ulcers noted other than groin rash on right . palpation of skin: no induration or nodules . Perfusion bilateral feet appears intact Psychiatric:  . Mental status . Mood, affect not assessable, somnolent   I have personally reviewed the following:   Today's Data  . UOP not recorded . Creatinine w/o sig change, K+ WNL . Hgb stable at 9 . Plts stabel at 85 . Anemia panel unrevealing  Scheduled Meds: . sodium chloride   Intravenous Once   Continuous Infusions: . sodium bicarbonate (isotonic) 150 mEq in D5W 1000 mL infusion 150 mL/hr at 02/16/20 0407  . sodium chloride      Principal Problem:   AKI (acute kidney injury) (HCC) Active Problems:   Metabolic acidosis   Hypotension   Diarrhea   Macrocytic anemia   Thrombocytopenia (HCC)   Fall at home, initial encounter   Femur fracture (HCC)   LOS: 1 day   How to contact the [facility]  Attending or Consulting provider 7A - 7P or covering provider during after hours 7P -7A, for this patient?  1. Check the care team in [facility] and look for a) attending/consulting [facility] provider listed and b) the [facility] team listed 2. Log into [email or web address] and use [place]'s universal password to access. If you do not have the password, please contact the hospital operator. 3. Locate the [facility] provider you are looking for under [facility] and page to a number that you can be directly reached. 4. If you still have difficulty reaching the provider, please page the [facility] (Director on Call) for the Hospitalists listed on amion for assistance.

## 2020-02-16 NOTE — Significant Event (Addendum)
CTSP for confirmed SBP 70s, confusion, rhonchi.  Patient alerted, responds w/ mumbles, cannot offer history  SBP 70s, started on Levophed, SBP up to 120s Appears encephalopathic, mumubles, does not follow commands. CV RRR no m/r/g Mild LLE w/o change Perfusion both feet appears intact abd distended, but soft and nontender  A/P  Refractory hypotension etiology unclear. Suspect volume depletion based on diarrhea, prerenal pattern; no s/s of infection, initial lactate was negative, but COVID+, appears incidental. Lungs sound clear, probably just upper airway noise. Got Ativan ~3 AM may be playing a role, had normal mentation yesterday and no h/o dementia (still works). --STAT CXR, check ABG, start peripheral Levophed, will consult PCCM for recs, may need ICU --if CXR w/o edema will bolus further fluids --check lactic acid, CRP, ferritin, ddimer.  Prognosis guarded, condition critical  D/w RN and dgt  Brendia Sacks, MD [facility]

## 2020-02-16 NOTE — ED Notes (Signed)
Family now at bedside. Chaplain consulted

## 2020-02-17 DIAGNOSIS — T17908A Unspecified foreign body in respiratory tract, part unspecified causing other injury, initial encounter: Secondary | ICD-10-CM | POA: Diagnosis not present

## 2020-02-17 DIAGNOSIS — U071 COVID-19: Secondary | ICD-10-CM | POA: Diagnosis not present

## 2020-02-17 DIAGNOSIS — N179 Acute kidney failure, unspecified: Secondary | ICD-10-CM | POA: Diagnosis not present

## 2020-02-17 DIAGNOSIS — R579 Shock, unspecified: Secondary | ICD-10-CM | POA: Diagnosis not present

## 2020-02-18 LAB — TYPE AND SCREEN
ABO/RH(D): A POS
Antibody Screen: NEGATIVE
Unit division: 0
Unit division: 0

## 2020-02-18 LAB — BPAM RBC
Blood Product Expiration Date: 202201272359
Blood Product Expiration Date: 202201272359
ISSUE DATE / TIME: 202201082036
Unit Type and Rh: 6200
Unit Type and Rh: 6200

## 2020-02-20 LAB — CULTURE, BLOOD (SINGLE): Culture: NO GROWTH

## 2020-03-10 NOTE — ED Notes (Signed)
Family feels patient is grimacing at time , Fentanyl increased.

## 2020-03-10 NOTE — ED Notes (Signed)
Family at bedside, pt appears comfortable.

## 2020-03-10 NOTE — ED Notes (Signed)
Wasted 200cc Fentanyl in med room with Marisue Ivan, Charity fundraiser

## 2020-03-10 NOTE — ED Notes (Addendum)
TOD pronounced at 0923 by Fraser Din, RN and Enriqueta Shutter, RN. Dr Irene Limbo aware.

## 2020-03-10 NOTE — ED Notes (Signed)
Medical Examiner called @ 0949-per Crystal, RN called by Marylene Land

## 2020-03-10 NOTE — Death Summary Note (Addendum)
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 Work-up revealed left proximal femur fracture, acute kidney injury, anemia, thrombocytopenia, hypotension, hypothermia, incidental COVID+.    Hospital Course:  Initial presentation suggested severe dehydration and volume depletion from 2 weeks of diarrhea complicated by poor oral intake, resulting in acute kidney injury, hypotension, suspected ATN.  Lactic acid was within normal limits.  Mentation normal on admission.  No signs or symptoms of sepsis on admission.  Covid positive noted, appear to be incidental with clear chest x-ray, no hypoxia and no respiratory symptoms.  Orthopedics was consulted to evaluate suspected proximal femur fracture.  Microcytic anemia and thrombocytopenia of unclear etiology also noted.  He was treated with aggressive hydration, passive rewarming and serial monitoring.  Overnight he became confused and hypotension did not correct with fluids.  Following morning he was noted to be lethargic, mildly hypoxic with very modest improvement in renal function. CBC was stable.  Hypotension persisted, treated with fluid bolus.  Chest x-ray revealed pulmonary edema.  Aspiration was suspected.  He was seen by critical care in consultation, briefly started on empiric antibiotics, vasopressor, remdesivir but failed to improve.  In conversation with  family was made full comfort care and subsequently died March 03, 2022.  Writer discussed the case with the medical examiner, who declined the case.    Time: 35 minutes  Signed:  Brendia Sacks, MD  [facility] 03-03-2020, 6:22 PM

## 2020-03-10 NOTE — ED Notes (Signed)
Patient is currently comfort care , family is at the bedside. Per family they feel he is resting comfortably. Family states they are ok at present  Encouraged to call RN for anything needed.

## 2020-03-10 NOTE — ED Notes (Signed)
Family called out stating they felt like patient was breathing a little different and thought he may be hurting, medication given pre prn order.

## 2020-03-10 NOTE — ED Notes (Signed)
This RN witnessed Arboriculturist of fentanyl

## 2020-03-10 NOTE — ED Notes (Signed)
Family remains at bedside, patient is resting at present.

## 2020-03-10 DEATH — deceased

## 2021-09-17 IMAGING — CT CT ABD-PELV W/O CM
2 of 4 series · 17 of 46 positions shown, 19 images · non-contrast
Comparison: None.

CLINICAL DATA: Witnessed fall.  Diarrhea.  Abdominal pain.  Sepsis.

EXAM:
CT ABDOMEN AND PELVIS WITHOUT CONTRAST
TECHNIQUE: Multidetector CT imaging of the abdomen and pelvis was performed
following the standard protocol without IV contrast.

[Series 4: a/p w/o 5mm · axial · non-contrast · 0.79mm/px · z∈[-770,-384]mm · 14 of 85 slices shown, 16 images]
[im 4/85  soft-tissue]
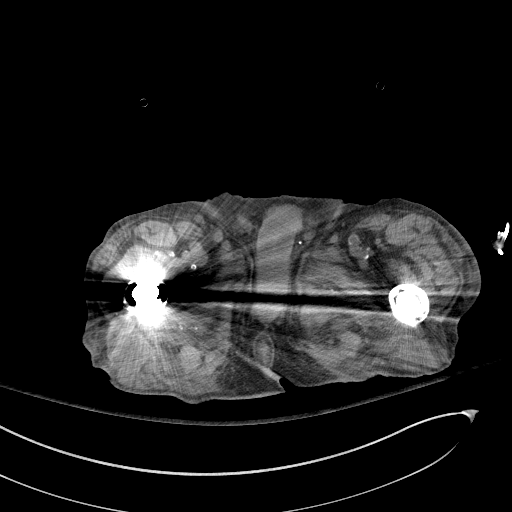
[im 4/85  bone]
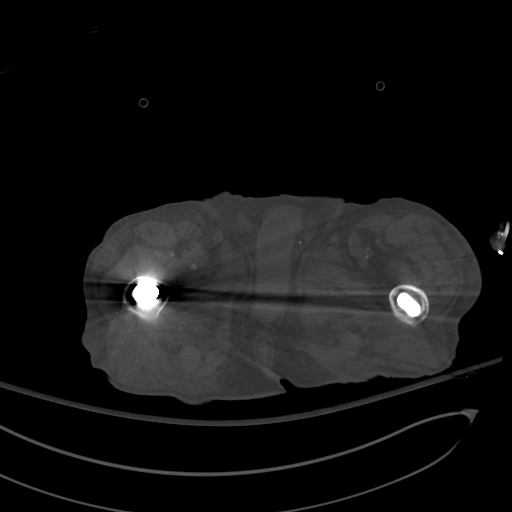
[im 11/85  soft-tissue]
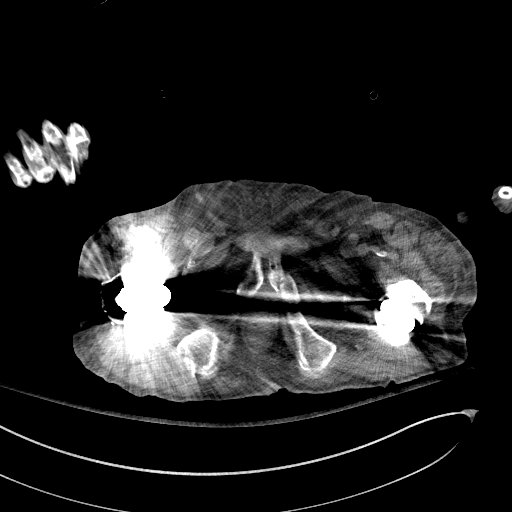
[im 17/85  soft-tissue]
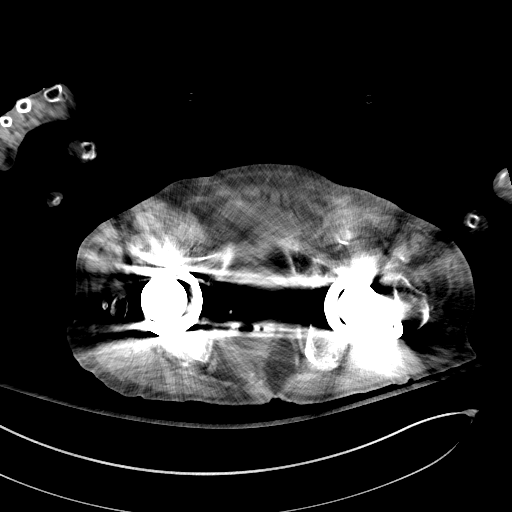
[im 24/85  soft-tissue]
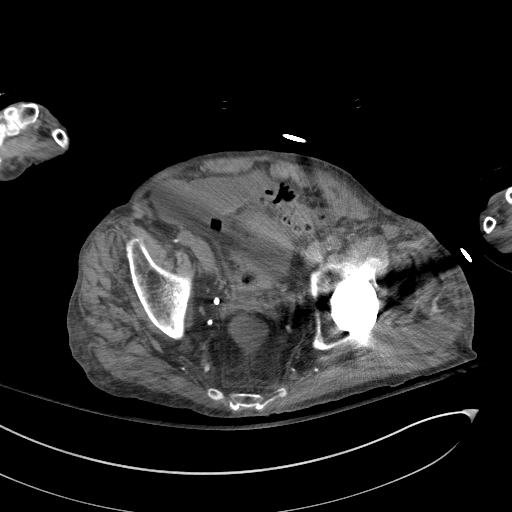
[im 31/85  soft-tissue]
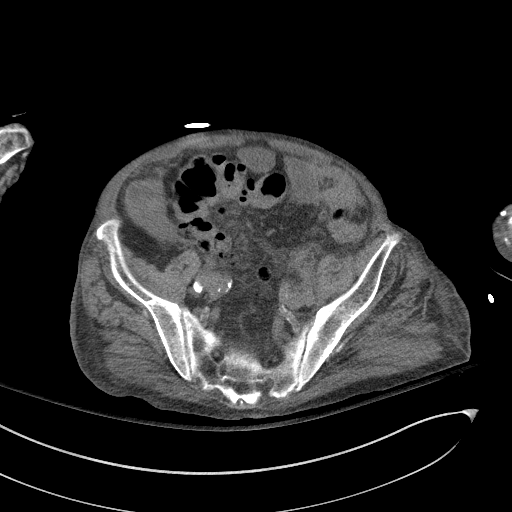
[im 37/85  soft-tissue]
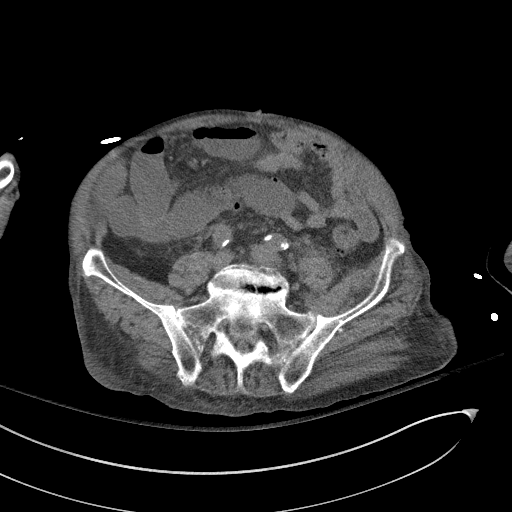
[im 41/85  soft-tissue]
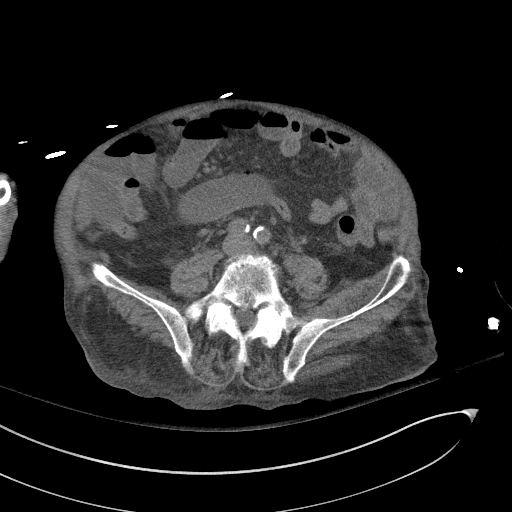
[im 48/85  soft-tissue]
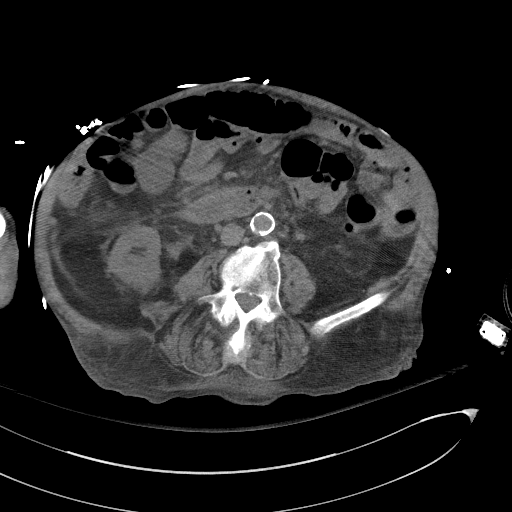
[im 51/85  soft-tissue]
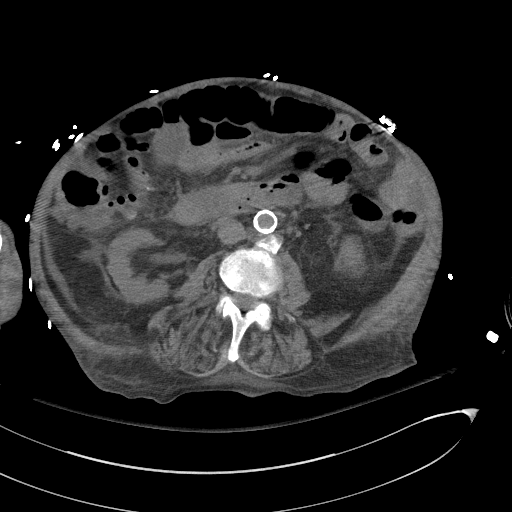
[im 51/85  bone]
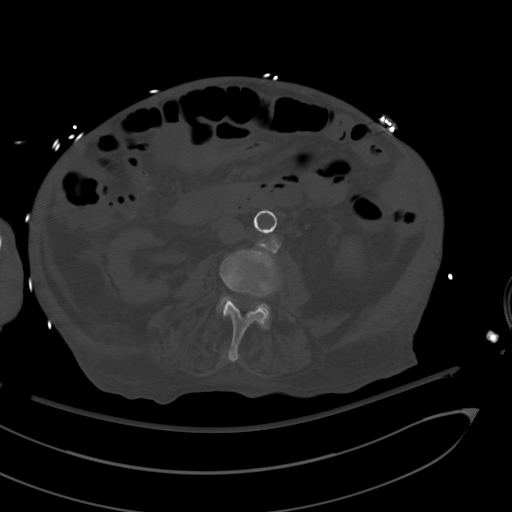
[im 58/85  soft-tissue]
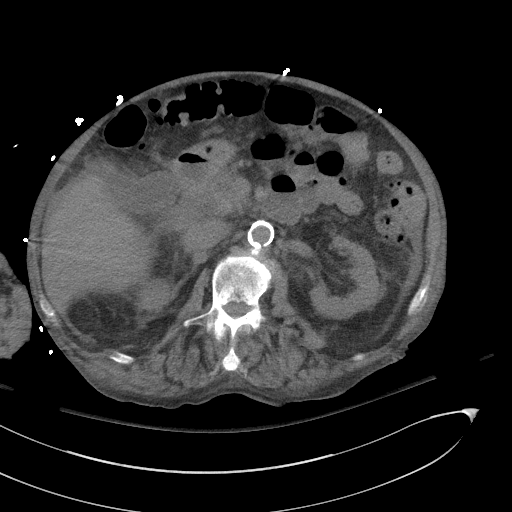
[im 64/85  soft-tissue]
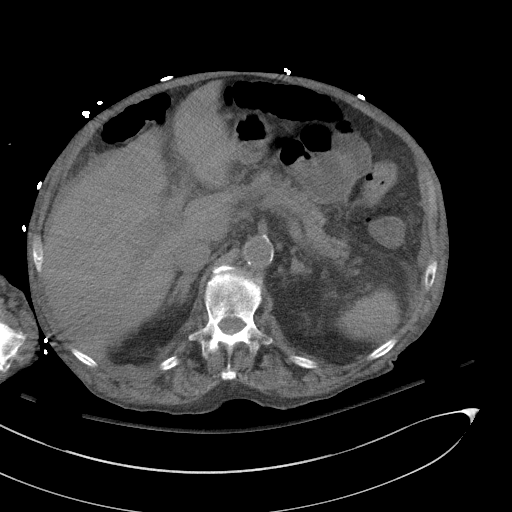
[im 68/85  soft-tissue]
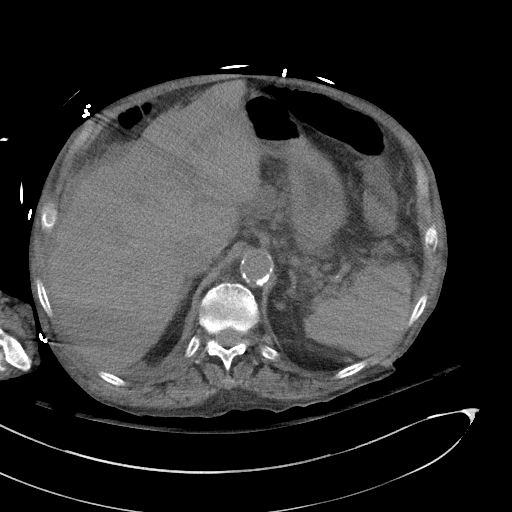
[im 74/85  soft-tissue]
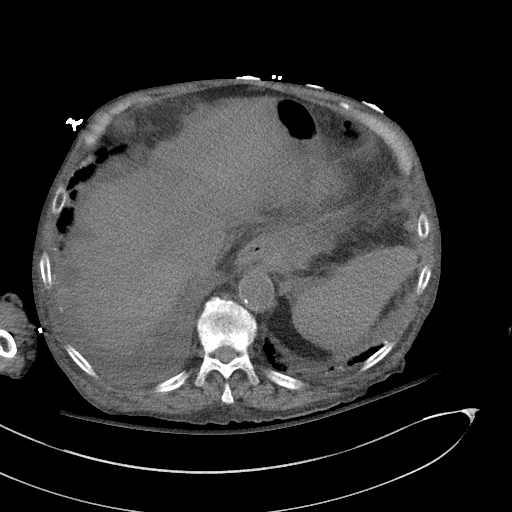
[im 81/85  soft-tissue]
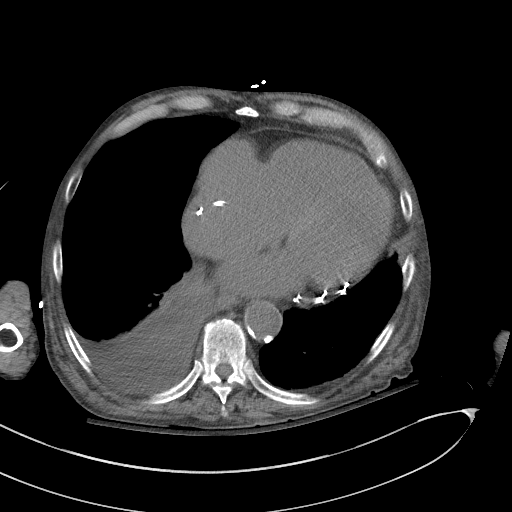

[Series 7: a/p w/o cor · coronal · non-contrast · 0.82mm/px · 3 of 146 slices shown]
[im 49/146  soft-tissue]
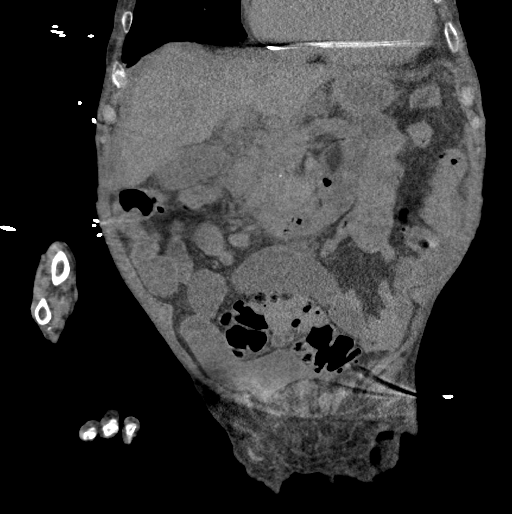
[im 65/146  soft-tissue]
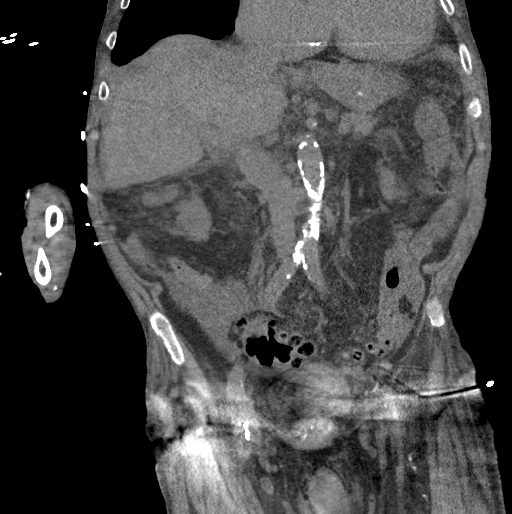
[im 81/146  soft-tissue]
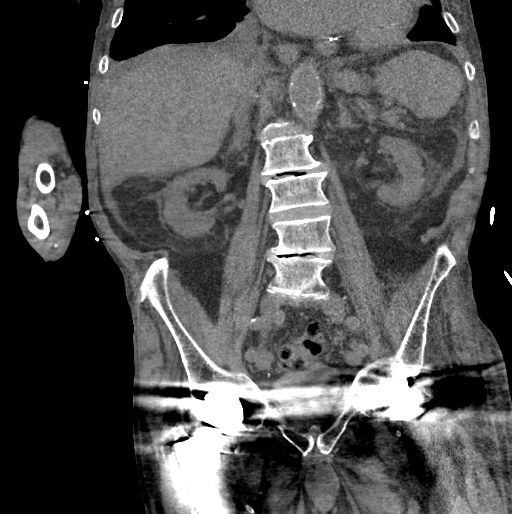

[17 of 46 positions shown; findings below may reference images not displayed]

FINDINGS: Lower chest: Moderate size right effusion layering dependently.
Dependent atelectasis in the right lung. The heart is enlarged.
Pacemaker defibrillator in place.

Hepatobiliary: No focal liver lesions seen. Question cirrhosis. No
calcified gallstones.

Pancreas: No pancreatic lesion evident on this study done without
contrast.

Spleen: Negative

Adrenals/Urinary Tract: Adrenal glands are normal. Kidneys are
normal. Bladder is normal, though not well seen because of streak
artifact from hip replacements.

Stomach/Bowel: Small amount of ascites distributed freely. No focal
small bowel abnormality seen. Liquid stool in the colon.
Diverticulosis without visible diverticulitis.

Vascular/Lymphatic: Aortic atherosclerosis. No aneurysm. IVC is
normal. No adenopathy.

Reproductive: Previous hysterectomy.  No pelvic mass.

Other: Ascites as noted above. No free air. Right inguinal hernia
containing fat.

Musculoskeletal: Chronic spinal degenerative changes. No fracture or
focal bone lesion. Bilateral hip replacements.
IMPRESSION: 1. Moderate size right effusion layering dependently. Dependent
atelectasis in the right lung. Cardiomegaly. Pacemaker defibrillator
in place.
2. Small amount of ascites distributed freely.
3. Question cirrhosis.
4. Liquid stool in the colon. Diverticulosis without visible
diverticulitis.
5. Aortic atherosclerosis.
6. Right inguinal hernia containing fat.
7. Consider subsequent scan with oral and intravenous contrast.

Aortic Atherosclerosis ([id number]).

## 2021-09-18 IMAGING — US US RENAL
1 series · 14 of 25 positions shown · non-contrast
Comparison: None.

CLINICAL DATA: Acute kidney injury.  Hypertension.

EXAM:
RENAL / URINARY TRACT ULTRASOUND COMPLETE

[Series 1: us renal · 14 of 73 slices shown]
[im 1/73]
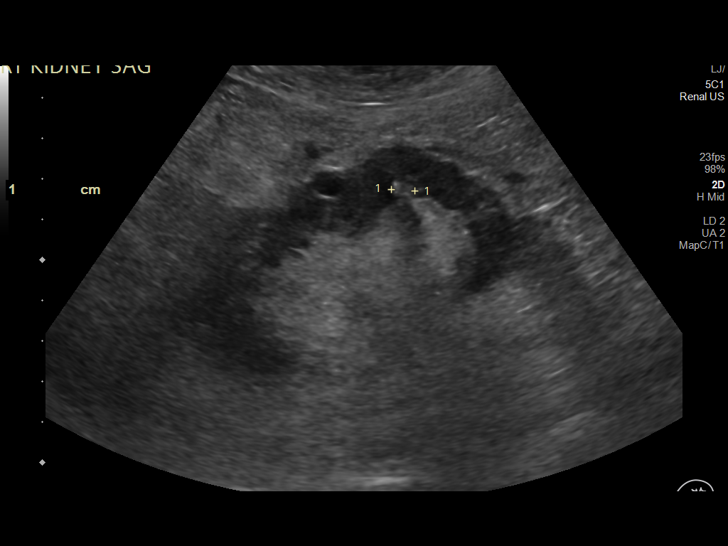
[im 7/73]
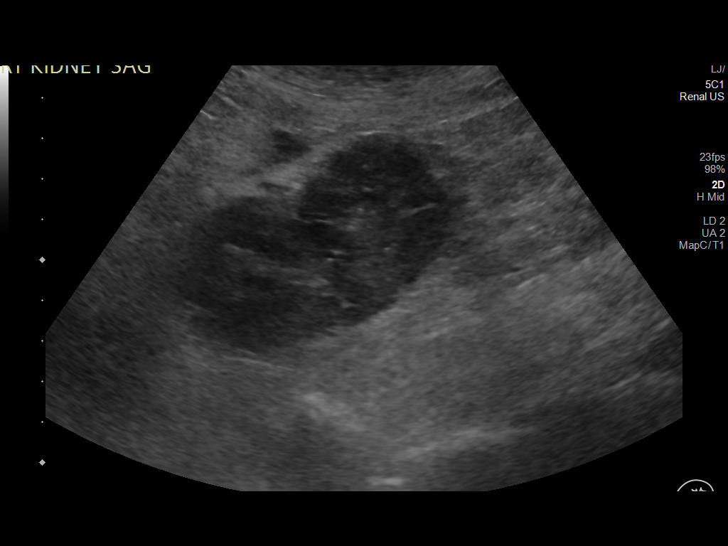
[im 13/73]
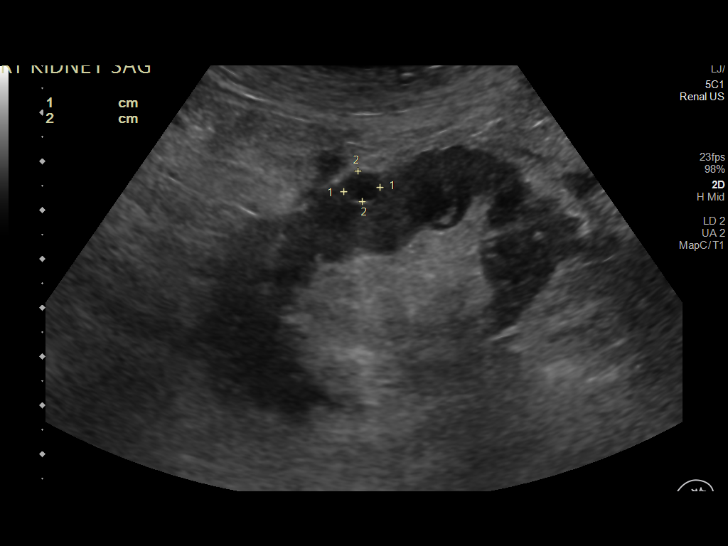
[im 19/73]
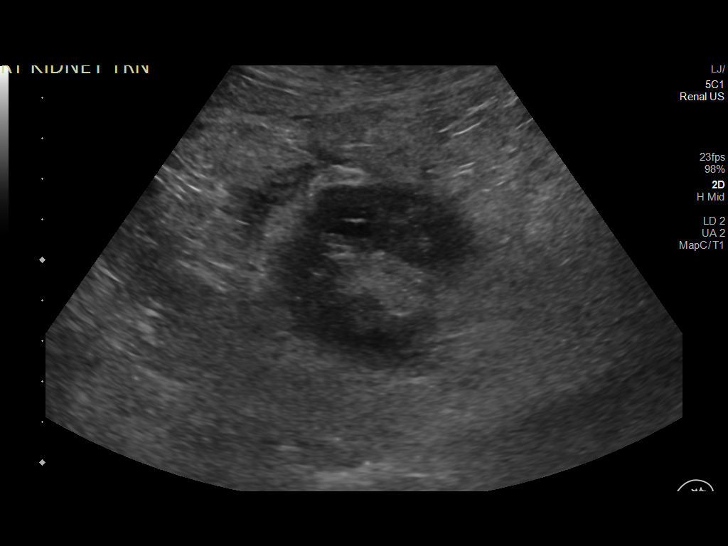
[im 25/73]
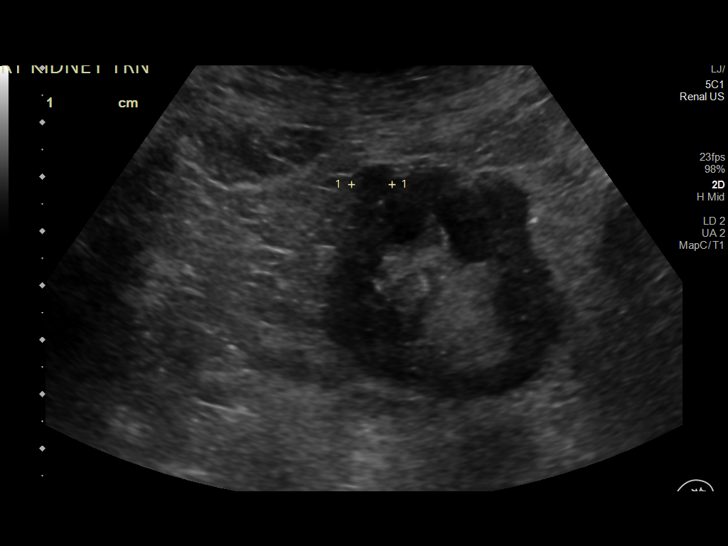
[im 28/73]
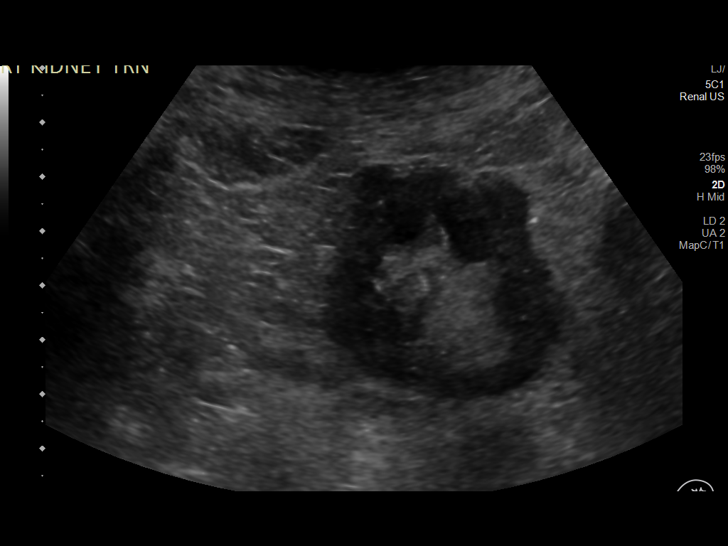
[im 34/73]
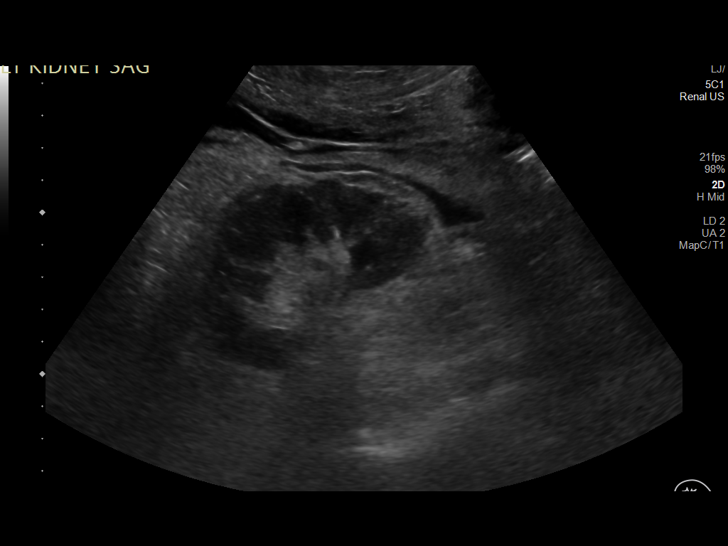
[im 40/73]
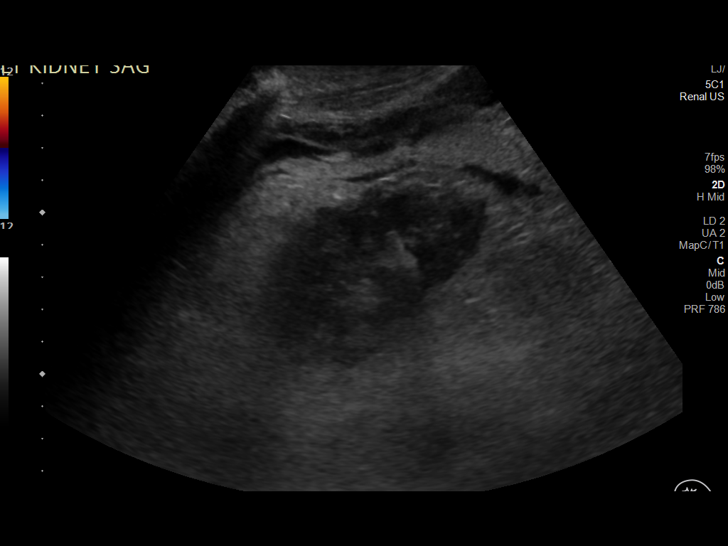
[im 46/73]
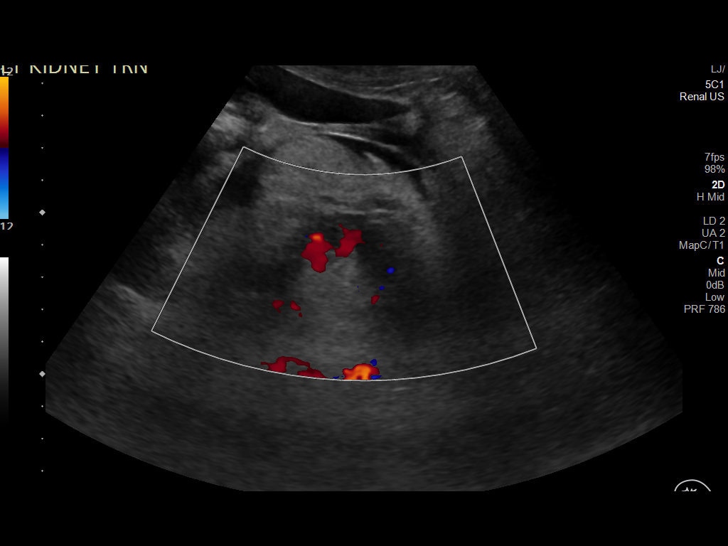
[im 49/73]
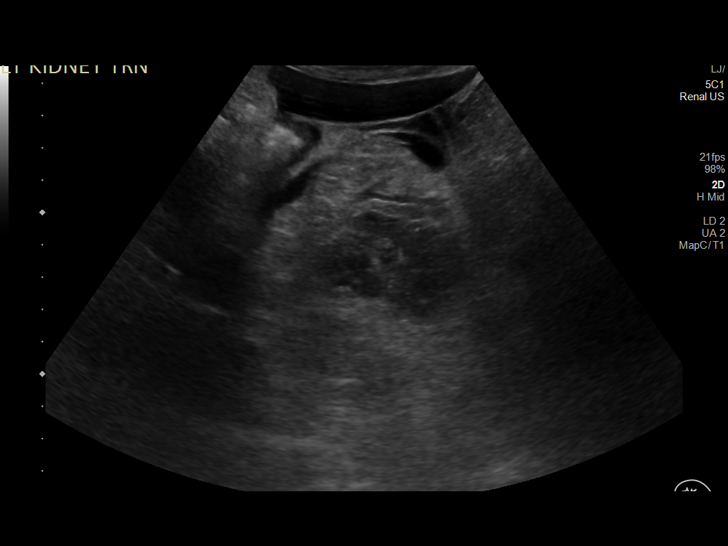
[im 55/73]
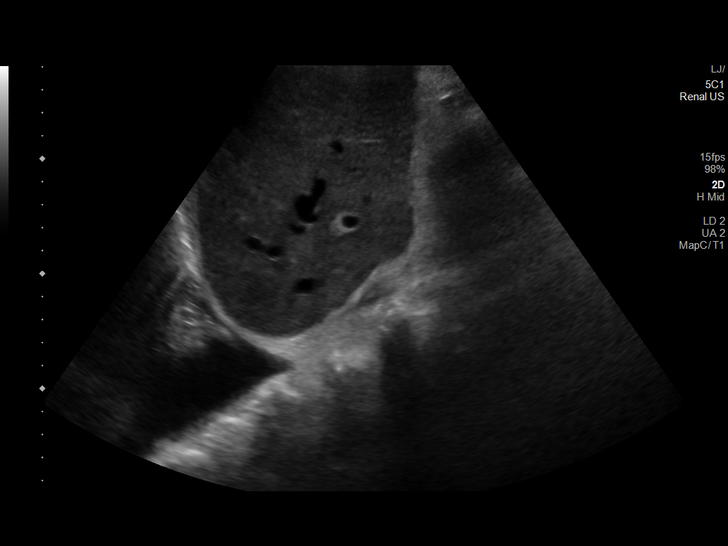
[im 61/73]
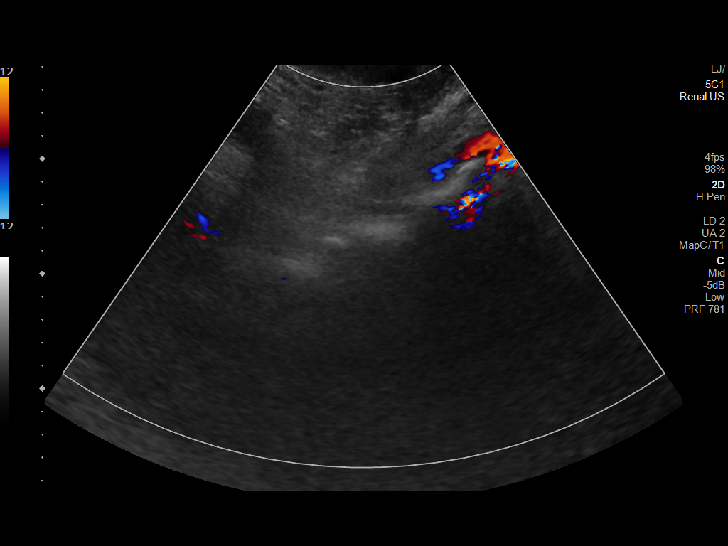
[im 67/73]
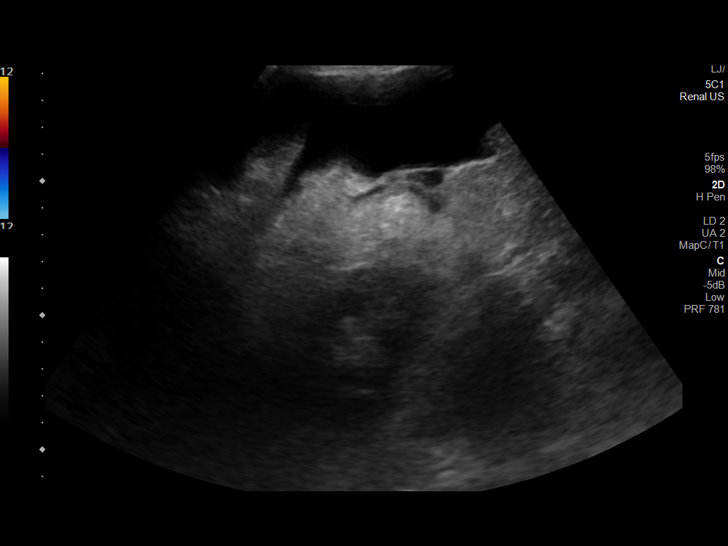
[im 73/73]
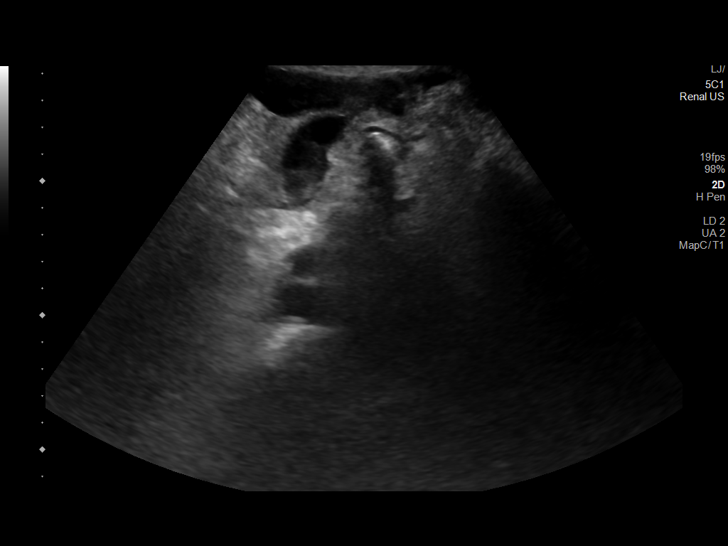

[14 of 25 positions shown; findings below may reference images not displayed]

FINDINGS: Right Kidney:

Renal measurements: 7.8 x 4.1 x 4.2 cm = volume: 69 mL. Echogenicity
within normal limits. Multiple small cysts. No suspicious mass or
hydronephrosis.

Left Kidney:

Renal measurements: 7.9 x 5.0 x 4.9 cm = volume: 101 mL.
Echogenicity within normal limits. No mass or hydronephrosis.

Bladder:

Decompressed by Foley catheter.

Other:

Small amount of ascites within the RIGHT upper quadrant, LEFT upper
quadrant and LEFT lower quadrant. Small perinephric fluid
bilaterally. These findings were also seen on yesterday's CT
abdomen.
IMPRESSION: 1. No acute findings of either kidney.  No hydronephrosis.
2. Ascites.
